# Patient Record
Sex: Female | Born: 1980 | Race: White | Hispanic: No | Marital: Married | Smoking: Never smoker
Health system: Southern US, Community
[De-identification: ages and names within clinical notes are randomized; demographics above are authoritative.]

## PROBLEM LIST (undated history)

## (undated) ENCOUNTER — Inpatient Hospital Stay (HOSPITAL_COMMUNITY): Payer: Self-pay

## (undated) DIAGNOSIS — Q210 Ventricular septal defect: Secondary | ICD-10-CM

## (undated) DIAGNOSIS — R011 Cardiac murmur, unspecified: Secondary | ICD-10-CM

## (undated) DIAGNOSIS — E119 Type 2 diabetes mellitus without complications: Secondary | ICD-10-CM

## (undated) DIAGNOSIS — F32A Depression, unspecified: Secondary | ICD-10-CM

## (undated) DIAGNOSIS — R1310 Dysphagia, unspecified: Secondary | ICD-10-CM

## (undated) DIAGNOSIS — K5900 Constipation, unspecified: Secondary | ICD-10-CM

## (undated) DIAGNOSIS — F329 Major depressive disorder, single episode, unspecified: Secondary | ICD-10-CM

## (undated) HISTORY — DX: Cardiac murmur, unspecified: R01.1

## (undated) HISTORY — PX: TONSILLECTOMY: SUR1361

## (undated) HISTORY — PX: CHOLECYSTECTOMY: SHX55

## (undated) HISTORY — PX: APPENDECTOMY: SHX54

---

## 1999-02-20 HISTORY — PX: VSD REPAIR: SHX276

## 2011-12-23 ENCOUNTER — Inpatient Hospital Stay (HOSPITAL_COMMUNITY): Payer: PRIVATE HEALTH INSURANCE

## 2011-12-23 ENCOUNTER — Encounter (HOSPITAL_COMMUNITY): Payer: Self-pay

## 2011-12-23 ENCOUNTER — Inpatient Hospital Stay (HOSPITAL_COMMUNITY)
Admission: AD | Admit: 2011-12-23 | Discharge: 2011-12-23 | Disposition: A | Discharge status: Home / Self Care | Payer: PRIVATE HEALTH INSURANCE | Source: Ambulatory Visit | Attending: Obstetrics and Gynecology | Admitting: Obstetrics and Gynecology

## 2011-12-23 DIAGNOSIS — O200 Threatened abortion: Secondary | ICD-10-CM | POA: Insufficient documentation

## 2011-12-23 LAB — URINALYSIS, ROUTINE W REFLEX MICROSCOPIC
Bilirubin Urine: NEGATIVE
Glucose, UA: >1000 mg/dL — AB
Specific Gravity, Urine: 1.025 (ref 1.005–1.030)
Urobilinogen, UA: 0.2 mg/dL (ref 0.0–1.0)

## 2011-12-23 LAB — URINE MICROSCOPIC-ADD ON

## 2011-12-23 LAB — CBC
HCT: 33 % — ABNORMAL LOW (ref 36.0–46.0)
Hemoglobin: 11 g/dL — ABNORMAL LOW (ref 12.0–15.0)
MCH: 30.1 pg (ref 26.0–34.0)
MCV: 90.2 fL (ref 78.0–100.0)
Platelets: 246 10*3/uL (ref 150–400)
RBC: 3.66 MIL/uL — ABNORMAL LOW (ref 3.87–5.11)
WBC: 6.6 10*3/uL (ref 4.0–10.5)

## 2011-12-23 LAB — ABO/RH: ABO/RH(D): A POS

## 2011-12-23 NOTE — Progress Notes (Signed)
Patient is here with c/o heavy vaginal bleeding. She states that her lmp was 11/23/2011 and she has been having light bleeding which she gets weekly bhcg at her dr office. She states that she was told that the level was low and her doctor states that it may too early she is miscarring. She states that she is due for a repeat bhcg on Monday.

## 2011-12-23 NOTE — Discharge Instructions (Signed)
Threatened Miscarriage  Bleeding during the first 20 weeks of pregnancy is common. This is sometimes called a threatened miscarriage. This is a pregnancy that is threatening to end before the twentieth week of pregnancy. Often this bleeding stops with bed rest or decreased activities as suggested by your caregiver and the pregnancy continues without any more problems. You may be asked to not have sexual intercourse, have orgasms or use tampons until further notice. Sometimes a threatened miscarriage can progress to a complete or incomplete miscarriage. This may or may not require further treatment. Some miscarriages occur before a woman misses a menstrual period and knows she is pregnant.  Miscarriages occur in 15 to 20% of all pregnancies and usually occur during the first 13 weeks of the pregnancy. The exact cause of a miscarriage is usually never known. A miscarriage is natures way of ending a pregnancy that is abnormal or would not make it to term. There are some things that may put you at risk to have a miscarriage, such as:   Hormone problems.   Infection of the uterus or cervix.   Chronic illness, diabetes for example, especially if it is not controlled.   Abnormal shaped uterus.   Fibroids in the uterus.   Incompetent cervix (the cervix is too weak to hold the baby).   Smoking.   Drinking too much alcohol. It's best not to drink any alcohol when you are pregnant.   Taking illegal drugs.  TREATMENT   When a miscarriage becomes complete and all products of conception (all the tissue in the uterus) have been passed, often no treatment is needed. If you think you passed tissue, save it in a container and take it to your doctor for evaluation. If the miscarriage is incomplete (parts of the fetus or placenta remain in the uterus), further treatment may be needed. The most common reason for further treatment is continued bleeding (hemorrhage) because pregnancy tissue did not pass out of the uterus. This  often occurs if a miscarriage is incomplete. Tissue left behind may also become infected. Treatment usually is dilatation and curettage (the removal of the remaining products of pregnancy. This can be done by a simple sucking procedure (suction curettage) or a simple scraping of the inside of the uterus. This may be done in the hospital or in the caregiver's office. This is only done when your caregiver knows that there is no chance for the pregnancy to proceed to term. This is determined by physical examination, negative pregnancy test, falling pregnancy hormone count and/or, an ultrasound revealing a dead fetus.  Miscarriages are often a very emotional time for prospective mothers and fathers. This is not you or your partners fault. It did not occur because of an inadequacy in you or your partner. Nearly all miscarriages occur because the pregnancy has started off wrongly. At least half of these pregnancies have a chromosomal abnormality. It is almost always not inherited. Others may have developmental problems with the fetus or placenta. This does not always show up even when the products miscarried are studied under the microscope. The miscarriage is nearly always not your fault and it is not likely that you could have prevented it from happening. If you are having emotional and grieving problems, talk to your health care provider and even seek counseling, if necessary, before getting pregnant again. You can begin trying for another pregnancy as soon as your caregiver says it is OK.  HOME CARE INSTRUCTIONS    Your caregiver may order   bed rest depending on how much bleeding and cramping you are having. You may be limited to only getting up to go to the bathroom. You may be allowed to continue light activity. You may need to make arrangements for the care of your other children and for any other responsibilities.   Keep track of the number of pads you use each day, how often you have to change pads and how  saturated (soaked) they are. Record this information.   DO NOT USE TAMPONS. Do not douche, have sexual intercourse or orgasms until approved by your caregiver.   You may receive a follow up appointment for re-evaluation of your pregnancy and a repeat blood test. Re-evaluation often occurs after 2 days and again in 4 to 6 weeks. It is very important that you follow-up in the recommended time period.   If you are Rh negative and the father is Rh positive or you do not know the fathers' blood type, you may receive a shot (Rh immune globulin) to help prevent abnormal antibodies that can develop and affect the baby in any future pregnancies.  SEEK IMMEDIATE MEDICAL CARE IF:   You have severe cramps in your stomach, back, or abdomen.   You have a sudden onset of severe pain in the lower part of your abdomen.   You develop chills.   You run an unexplained temperature of 101 F (38.3 C) or higher.   You pass large clots or tissue. Save any tissue for your caregiver to inspect.   Your bleeding increases or you become light-headed, weak, or have fainting episodes.   You have a gush of fluid from your vagina.   You pass out. This could mean you have a tubal (ectopic) pregnancy.  Document Released: 10/08/2005 Document Revised: 09/27/2011 Document Reviewed: 05/24/2008  [employer] Patient Information 2012 [employer].

## 2011-12-23 NOTE — Progress Notes (Signed)
W. Muhammed CNM in to discuss u/s results and d/c plan. Written and verbal d/c instructions given and understanding voiced 

## 2011-12-23 NOTE — Progress Notes (Signed)
Threasa Heads CNM in to see pt. Spec exam and bimanual done.

## 2011-12-23 NOTE — ED Provider Notes (Signed)
History     Chief Complaint  Patient presents with  . Vaginal Bleeding   HPI  Patient is here with c/o heavy vaginal bleeding that started yesterday around 1pm. She states that her lmp was 11/23/2011 and she has been having light bleeding throughout pregnancy.  Progesterone levels have been low, receiving PO daily.  Ultrasound not completed in office.  Due for a repeat bhcg on Monday.    Past Medical History  Diagnosis Date  . Diabetes mellitus     Past Surgical History  Procedure Date  . 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 Appendectomy   . Tonsillectomy   . Gallbladder surgery     Family History  Problem Relation Age of Onset  . Anesthesia problems Neg Hx   . Hypotension Neg Hx   . Malignant hyperthermia Neg Hx   . Pseudochol deficiency Neg Hx     History  Substance Use Topics  . Smoking status: Never Smoker   . Smokeless tobacco: Not on file  . Alcohol Use: No    Allergies:  Allergies  Allergen Reactions  . Sulfa Antibiotics Hives    Prescriptions prior to admission  Medication Sig Dispense Refill  . insulin glargine (LANTUS) 100 UNIT/ML injection Inject 31 Units into the skin daily.      . insulin lispro (HUMALOG) 100 UNIT/ML injection Inject into the skin 3 (three) times daily before meals.      . progesterone (PROMETRIUM) 200 MG capsule Take 200 mg by mouth daily.        Review of Systems  Gastrointestinal: Positive for abdominal pain (cramping).  Genitourinary:       Vaginal bleeding  All other systems reviewed and are negative.   Physical Exam   Height 5\' 11"  (1.803 m), weight 78.926 kg (174 lb), last menstrual period 11/23/2011.  Physical Exam  Constitutional: She is oriented to person, place, and time. She appears well-developed and well-nourished. No distress.  HENT:  Head: Normocephalic.  Neck: Normal range of motion. Neck supple.  GI: Soft. She exhibits no mass. There is no tenderness.  Genitourinary: There is bleeding (scant, no clots) around the  vagina.       Cervix - closed  Neurological: She is alert and oriented to person, place, and time.  Skin: Skin is warm and dry.    MAU Course  Procedures  Results for orders placed during the hospital encounter of 12/23/11 (from the past 24 hour(s))  CBC     Status: Abnormal   Collection Time   12/23/11  1:28 AM      Component Value Range   WBC 6.6  4.0 - 10.5 (K/uL)   RBC 3.66 (*) 3.87 - 5.11 (MIL/uL)   Hemoglobin 11.0 (*) 12.0 - 15.0 (g/dL)   HCT 16.1 (*) 09.6 - 46.0 (%)   MCV 90.2  78.0 - 100.0 (fL)   MCH 30.1  26.0 - 34.0 (pg)   MCHC 33.3  30.0 - 36.0 (g/dL)   RDW 04.5  40.9 - 81.1 (%)   Platelets 246  150 - 400 (K/uL)  URINALYSIS, ROUTINE W REFLEX MICROSCOPIC     Status: Abnormal   Collection Time   12/23/11  1:30 AM      Component Value Range   Color, Urine YELLOW  YELLOW    APPearance CLEAR  CLEAR    Specific Gravity, Urine 1.025  1.005 - 1.030    pH 6.0  5.0 - 8.0    Glucose, UA >1000 (*) NEGATIVE (mg/dL)  Hgb urine dipstick LARGE (*) NEGATIVE    Bilirubin Urine NEGATIVE  NEGATIVE    Ketones, ur NEGATIVE  NEGATIVE (mg/dL)   Protein, ur NEGATIVE  NEGATIVE (mg/dL)   Urobilinogen, UA 0.2  0.0 - 1.0 (mg/dL)   Nitrite NEGATIVE  NEGATIVE    Leukocytes, UA NEGATIVE  NEGATIVE   URINE MICROSCOPIC-ADD ON     Status: Abnormal   Collection Time   12/23/11  1:30 AM      Component Value Range   Squamous Epithelial / LPF FEW (*) RARE    RBC / HPF TOO NUMEROUS TO COUNT  <3 (RBC/hpf)  POCT PREGNANCY, URINE     Status: Abnormal   Collection Time   12/23/11  1:50 AM      Component Value Range   Preg Test, Ur POSITIVE (*) NEGATIVE   HCG, QUANTITATIVE, PREGNANCY     Status: Abnormal   Collection Time   12/23/11  1:54 AM      Component Value Range   hCG, Beta Chain, Quant, S 692 (*) <5 (mIU/mL)  ABO/RH     Status: Normal   Collection Time   12/23/11  3:14 AM      Component Value Range   ABO/RH(D) A POS     Ultrasound:  Free fluid rt adnexa; peristalsing bowel adjacent to right  ovary; could not identify any adnexal masses. (preliminary report). Consulted with Dr. Henderson Cloud; reviewed labs and preliminary ultrasound report>dc home with follow-up in office on Monday.   Assessment and Plan  Threatened Miscarriage  Plan: DC home F/u in office on Monday Bleeding precautions given   [facility] 12/23/2011, 2:55 AM

## 2012-05-13 ENCOUNTER — Other Ambulatory Visit: Payer: Self-pay | Admitting: Obstetrics and Gynecology

## 2012-05-13 DIAGNOSIS — N6320 Unspecified lump in the left breast, unspecified quadrant: Secondary | ICD-10-CM

## 2012-05-21 ENCOUNTER — Ambulatory Visit
Admission: RE | Admit: 2012-05-21 | Discharge: 2012-05-21 | Disposition: A | Discharge status: Home / Self Care | Payer: PRIVATE HEALTH INSURANCE | Source: Ambulatory Visit | Attending: Obstetrics and Gynecology | Admitting: Obstetrics and Gynecology

## 2012-05-21 DIAGNOSIS — N6320 Unspecified lump in the left breast, unspecified quadrant: Secondary | ICD-10-CM

## 2012-10-22 HISTORY — PX: DILATION AND CURETTAGE OF UTERUS: SHX78

## 2012-10-24 ENCOUNTER — Other Ambulatory Visit: Payer: Self-pay | Admitting: Obstetrics and Gynecology

## 2012-10-24 DIAGNOSIS — N6009 Solitary cyst of unspecified breast: Secondary | ICD-10-CM

## 2012-10-29 ENCOUNTER — Ambulatory Visit
Admission: RE | Admit: 2012-10-29 | Discharge: 2012-10-29 | Disposition: A | Discharge status: Home / Self Care | Payer: PRIVATE HEALTH INSURANCE | Source: Ambulatory Visit | Attending: Obstetrics and Gynecology | Admitting: Obstetrics and Gynecology

## 2012-10-29 DIAGNOSIS — N6009 Solitary cyst of unspecified breast: Secondary | ICD-10-CM

## 2013-01-26 ENCOUNTER — Encounter (HOSPITAL_COMMUNITY): Payer: Self-pay | Admitting: *Deleted

## 2013-01-26 ENCOUNTER — Emergency Department (HOSPITAL_COMMUNITY)
Admission: EM | Admit: 2013-01-26 | Discharge: 2013-01-27 | Disposition: A | Discharge status: Home / Self Care | Payer: Worker's Compensation | Attending: Emergency Medicine | Admitting: Emergency Medicine

## 2013-01-26 DIAGNOSIS — H538 Other visual disturbances: Secondary | ICD-10-CM | POA: Insufficient documentation

## 2013-01-26 DIAGNOSIS — Z794 Long term (current) use of insulin: Secondary | ICD-10-CM | POA: Insufficient documentation

## 2013-01-26 DIAGNOSIS — E119 Type 2 diabetes mellitus without complications: Secondary | ICD-10-CM | POA: Insufficient documentation

## 2013-01-26 DIAGNOSIS — Y9289 Other specified places as the place of occurrence of the external cause: Secondary | ICD-10-CM | POA: Insufficient documentation

## 2013-01-26 DIAGNOSIS — Y939 Activity, unspecified: Secondary | ICD-10-CM | POA: Insufficient documentation

## 2013-01-26 DIAGNOSIS — S060X0A Concussion without loss of consciousness, initial encounter: Secondary | ICD-10-CM | POA: Insufficient documentation

## 2013-01-26 DIAGNOSIS — W208XXA Other cause of strike by thrown, projected or falling object, initial encounter: Secondary | ICD-10-CM | POA: Insufficient documentation

## 2013-01-26 DIAGNOSIS — R110 Nausea: Secondary | ICD-10-CM | POA: Insufficient documentation

## 2013-01-26 DIAGNOSIS — Y990 Civilian activity done for income or pay: Secondary | ICD-10-CM | POA: Insufficient documentation

## 2013-01-26 LAB — GLUCOSE, CAPILLARY: Glucose-Capillary: 294 mg/dL — ABNORMAL HIGH (ref 70–99)

## 2013-01-26 NOTE — ED Notes (Signed)
Pt states she was at work last night, pitcher fell on top of her head and caused her to fall down.  Denies LOC, states her vision was blurred last night but normal now.  Also c/o nausea when it happened, but no nausea at this time.  Still c/o HA.

## 2013-01-27 MED ORDER — METOCLOPRAMIDE HCL 5 MG/ML IJ SOLN
10.0000 mg | Freq: Once | INTRAMUSCULAR | Status: AC
  Administered 2013-01-27: 10 mg via INTRAMUSCULAR
  Filled 2013-01-27: qty 2

## 2013-01-27 MED ORDER — DIPHENHYDRAMINE HCL 50 MG/ML IJ SOLN
25.0000 mg | Freq: Once | INTRAMUSCULAR | Status: AC
  Administered 2013-01-27: 25 mg via INTRAMUSCULAR
  Filled 2013-01-27: qty 1

## 2013-01-27 MED ORDER — DEXAMETHASONE SODIUM PHOSPHATE 10 MG/ML IJ SOLN
10.0000 mg | Freq: Once | INTRAMUSCULAR | Status: AC
  Administered 2013-01-27: 10 mg via INTRAMUSCULAR
  Filled 2013-01-27: qty 1

## 2013-01-27 MED ORDER — KETOROLAC TROMETHAMINE 30 MG/ML IJ SOLN
30.0000 mg | Freq: Once | INTRAMUSCULAR | Status: AC
  Administered 2013-01-27: 30 mg via INTRAMUSCULAR
  Filled 2013-01-27: qty 1

## 2013-01-27 NOTE — ED Provider Notes (Signed)
History     CSN: [id number]  Arrival date & time 01/26/13  2202   First MD Initiated Contact with Patient 01/27/13 0016      Chief Complaint  Patient presents with  . Head Injury   HPI  History provided by the patient. Patient is a 32 year old female with history of diabetes who presents with complaints of continued headache symptoms and occasional nausea following an injury yesterday evening. Patient was at work at [employer] patient in her cupboard per bottle for line she reports a metal picture containing coffee creamer fell off the top shelf landing on her head. The picture fell approximately 3-4 feet before striking her on the head. Patient states this did knock her to her knees on the ground but denies any LOC. She reports having some initial spots in vision with headache and nausea sensation. Nausea and vision symptoms improved she reports a small swelling and tenderness to the scalp. She later returned home and slept and felt better in the morning with symptoms of headache gradually returned later in the afternoon. She has had brief episodes of nausea as well. Denies any vision problems. Denies any significant fatigue. No short-term memory problems. No vertigo or dizziness symptoms. No episodes of vomiting. No weakness or numbness in extremities. No other aggravating or alleviating factors. No past history of significant head injuries.    Past Medical History  Diagnosis Date  . Diabetes mellitus     Past Surgical History  Procedure Laterality Date  . Vsd repair  May 2000  . Appendectomy    . Tonsillectomy    . Gallbladder surgery      Family History  Problem Relation Age of Onset  . Anesthesia problems Neg Hx   . Hypotension Neg Hx   . Malignant hyperthermia Neg Hx   . Pseudochol deficiency Neg Hx     History  Substance Use Topics  . Smoking status: Never Smoker   . Smokeless tobacco: Not on file  . Alcohol Use: No    OB History   Grav Para Term Preterm Abortions  TAB SAB Ect Mult Living   2 0 0 0 1 0 1 0 0 0       Review of Systems  Constitutional: Negative for fever.  HENT: Negative for neck pain.   Gastrointestinal: Positive for nausea.  Neurological: Positive for headaches. Negative for weakness and numbness.  All other systems reviewed and are negative.    Allergies  Sulfa antibiotics  Home Medications   Current Outpatient Rx  Name  Route  Sig  Dispense  Refill  . ibuprofen (ADVIL,MOTRIN) 200 MG tablet   Oral   Take 200-800 mg by mouth every 6 (six) hours as needed for pain.         Marland Kitchen insulin glargine (LANTUS) 100 UNIT/ML injection   Subcutaneous   Inject 22 Units into the skin every morning.          . insulin lispro (HUMALOG) 100 UNIT/ML injection   Subcutaneous   Inject into the skin 3 (three) times daily before meals. Sliding scale-1 unit per 50 over 150 and 1 unit per carb         . naproxen (NAPROSYN) 250 MG tablet   Oral   Take 250 mg by mouth 2 (two) times daily as needed (pain).         . Norethin Ace-Eth Estrad-FE (MINASTRIN 24 FE) 1-20 MG-MCG(24) CHEW   Oral   Chew by mouth.  BP 127/84  Pulse 79  Temp(Src) 97.8 F (36.6 C) (Oral)  Resp 20  SpO2 99%  Physical Exam  Nursing note and vitals reviewed. Constitutional: She is oriented to person, place, and time. She appears well-developed and well-nourished. No distress.  HENT:  Head: Normocephalic and atraumatic.  No significant signs of trauma to the head. Patient does exhibit some scalp tenderness to the right parietal area. There is no erythema of the scalp swelling. No depressed skull. No battle sign or raccoon eyes. No hemotympanum  Eyes: Conjunctivae and EOM are normal. Pupils are equal, round, and reactive to light.  Neck: Normal range of motion. Neck supple.  No meningeal signs  Cardiovascular: Normal rate and regular rhythm.   No murmur heard. Pulmonary/Chest: Effort normal and breath sounds normal. No respiratory distress. She  has no wheezes. She has no rales.  Abdominal: Soft. There is no tenderness. There is no rigidity, no rebound, no guarding, no CVA tenderness and no tenderness at McBurney's point.  Musculoskeletal: Normal range of motion.  Neurological: She is alert and oriented to person, place, and time. She has normal strength. No cranial nerve deficit or sensory deficit. Gait normal.  Skin: Skin is warm and dry. No rash noted.  Psychiatric: She has a normal mood and affect. Her behavior is normal.    ED Course  Procedures       1. Concussion with no loss of consciousness, initial encounter       MDM  12:10AM patient seen and evaluated. Patient well-appearing in no acute distress. No significant signs, to the head. Normal nonfocal neuro exam. No indications for CT imaging.     Angus Seller, PA-C 01/27/13 2154

## 2013-01-27 NOTE — ED Notes (Signed)
Per david PhD benadryl & reglan can be mixed and decadron and toradol can be mixed.

## 2013-01-28 NOTE — ED Provider Notes (Signed)
Medical screening examination/treatment/procedure(s) were performed by non-physician practitioner and as supervising physician I was immediately available for consultation/collaboration.  Altie Savard, MD 01/28/13 0626 

## 2013-12-02 ENCOUNTER — Ambulatory Visit (INDEPENDENT_AMBULATORY_CARE_PROVIDER_SITE_OTHER): Payer: PRIVATE HEALTH INSURANCE | Admitting: Podiatry

## 2013-12-02 ENCOUNTER — Encounter: Payer: Self-pay | Admitting: Podiatry

## 2013-12-02 VITALS — BP 79/46 | HR 76 | Resp 16 | Ht 71.0 in | Wt 173.0 lb

## 2013-12-02 DIAGNOSIS — B353 Tinea pedis: Secondary | ICD-10-CM

## 2013-12-02 MED ORDER — NAFTIFINE HCL 2 % EX CREA: TOPICAL_CREAM | CUTANEOUS | Status: DC

## 2013-12-02 NOTE — Progress Notes (Signed)
   Subjective:    Patient ID: Karen Byrd, female    DOB: 12/12/1980, 33 y.o.   MRN: [id number]  HPI    Review of Systems  Allergic/Immunologic: Negative.   Neurological: Positive for headaches.       Objective:   Physical Exam: Vital signs are stable she is alert and oriented x3. Pulses are strongly palpable bilateral. Her skin looks good I see no lesions. She does have mild interdigital tinea pedis.        Assessment & Plan:  Assessment: Diabetes without complications. Interdigital tinea pedis bilateral.  Plan: Discussed etiology pathology conservative versus surgical therapies at this point I prescribed her Naftin cream 2% to be applied twice daily for a 60 g tube. I will followup with her as needed.

## 2014-01-23 ENCOUNTER — Emergency Department: Payer: Self-pay | Admitting: Emergency Medicine

## 2014-04-29 ENCOUNTER — Emergency Department (HOSPITAL_COMMUNITY): Payer: PRIVATE HEALTH INSURANCE

## 2014-04-29 ENCOUNTER — Emergency Department (HOSPITAL_COMMUNITY)
Admission: EM | Admit: 2014-04-29 | Discharge: 2014-04-29 | Disposition: A | Discharge status: Home / Self Care | Payer: PRIVATE HEALTH INSURANCE | Attending: Emergency Medicine | Admitting: Emergency Medicine

## 2014-04-29 ENCOUNTER — Encounter (HOSPITAL_COMMUNITY): Payer: Self-pay | Admitting: Emergency Medicine

## 2014-04-29 DIAGNOSIS — Z8774 Personal history of (corrected) congenital malformations of heart and circulatory system: Secondary | ICD-10-CM | POA: Insufficient documentation

## 2014-04-29 DIAGNOSIS — E109 Type 1 diabetes mellitus without complications: Secondary | ICD-10-CM | POA: Insufficient documentation

## 2014-04-29 DIAGNOSIS — R079 Chest pain, unspecified: Secondary | ICD-10-CM | POA: Insufficient documentation

## 2014-04-29 DIAGNOSIS — R739 Hyperglycemia, unspecified: Secondary | ICD-10-CM

## 2014-04-29 HISTORY — DX: Ventricular septal defect: Q21.0

## 2014-04-29 HISTORY — DX: Type 2 diabetes mellitus without complications: E11.9

## 2014-04-29 LAB — I-STAT TROPONIN, ED
TROPONIN I, POC: 0 ng/mL (ref 0.00–0.08)
Troponin i, poc: 0 ng/mL (ref 0.00–0.08)

## 2014-04-29 LAB — CBC
HEMATOCRIT: 42.2 % (ref 36.0–46.0)
Hemoglobin: 13.9 g/dL (ref 12.0–15.0)
MCH: 31.2 pg (ref 26.0–34.0)
MCHC: 32.9 g/dL (ref 30.0–36.0)
MCV: 94.6 fL (ref 78.0–100.0)
Platelets: 280 10*3/uL (ref 150–400)
RBC: 4.46 MIL/uL (ref 3.87–5.11)
RDW: 12.3 % (ref 11.5–15.5)
WBC: 4.8 10*3/uL (ref 4.0–10.5)

## 2014-04-29 LAB — BASIC METABOLIC PANEL
Anion gap: 13 (ref 5–15)
BUN: 5 mg/dL — ABNORMAL LOW (ref 6–23)
CALCIUM: 9 mg/dL (ref 8.4–10.5)
CO2: 25 mEq/L (ref 19–32)
CREATININE: 0.62 mg/dL (ref 0.50–1.10)
Chloride: 99 mEq/L (ref 96–112)
GFR calc Af Amer: >90 mL/min (ref >90)
GLUCOSE: 218 mg/dL — AB (ref 70–99)
Potassium: 4.5 mEq/L (ref 3.7–5.3)
Sodium: 137 mEq/L (ref 137–147)

## 2014-04-29 MED ORDER — ASPIRIN 81 MG PO CHEW
324.0000 mg | CHEWABLE_TABLET | Freq: Once | ORAL | Status: AC
  Administered 2014-04-29: 324 mg via ORAL
  Filled 2014-04-29: qty 4

## 2014-04-29 NOTE — ED Provider Notes (Signed)
CSN: [id number]     Arrival date & time 04/29/14  1138 History   First MD Initiated Contact with Patient 04/29/14 1336     Chief Complaint  Patient presents with  . Chest Pain     (Consider location/radiation/quality/duration/timing/severity/associated sxs/prior Treatment) HPI Karen Byrd is a 33 y.o. female with hx of VSD repair as an infant, and type 1 DM, who presents to ED with complaint of chest pain. Pt states she began having left sided dull pressure like chest pain 3 days ago. States last night in the middle of the night she had two episodes of sharp pain lasting few min at a time. She did not take anything for her symptoms. She states pain did not radiate but she does have some tingling sensation in left arm. Denies nausea, vomiting, shortness of breath, diaphoresis.  No worsening pain on exertion. No back pain. No cough. PT is not a smoker. No other cardiac hx. Family hx of MI in her after at age 59.    Past Medical History  Diagnosis Date  . Diabetes mellitus without complication   . VSD (ventricular septal defect)    Past Surgical History  Procedure Laterality Date  . Vsd repair     No family history on file. History  Substance Use Topics  . Smoking status: Never Smoker   . Smokeless tobacco: Not on file  . Alcohol Use: Not on file   OB History   Grav Para Term Preterm Abortions TAB SAB Ect Mult Living                 Review of Systems  Constitutional: Negative for fever and chills.  Respiratory: Positive for chest tightness. Negative for cough and shortness of breath.   Cardiovascular: Positive for chest pain. Negative for palpitations and leg swelling.  Gastrointestinal: Negative for nausea, vomiting, abdominal pain and diarrhea.  Genitourinary: Negative for dysuria, flank pain and pelvic pain.  Musculoskeletal: Negative for arthralgias, myalgias, neck pain and neck stiffness.  Skin: Negative for rash.  Neurological: Negative for dizziness, weakness and  headaches.  All other systems reviewed and are negative.     Allergies  Sulfa antibiotics  Home Medications   Prior to Admission medications   Not on File   BP 105/52  Pulse 80  Temp(Src) 98 F (36.7 C) (Oral)  Resp 18  Wt 170 lb (77.111 kg)  SpO2 100%  LMP 04/21/2014 Physical Exam  Nursing note and vitals reviewed. Constitutional: She appears well-developed and well-nourished. No distress.  HENT:  Head: Normocephalic.  Eyes: Conjunctivae are normal.  Neck: Neck supple.  Cardiovascular: Normal rate, regular rhythm, normal heart sounds and intact distal pulses.   Pulmonary/Chest: Effort normal and breath sounds normal. No respiratory distress. She has no wheezes. She has no rales. She exhibits tenderness.  Tenderness over left pectoralis muscle. Pain reproduced with left arm movement as well  Abdominal: Soft. Bowel sounds are normal. She exhibits no distension. There is no tenderness. There is no rebound.  Musculoskeletal: She exhibits no edema.  Neurological: She is alert.  Skin: Skin is warm and dry.  Psychiatric: She has a normal mood and affect. Her behavior is normal.    ED Course  Procedures (including critical care time) Labs Review Labs Reviewed  BASIC METABOLIC PANEL - Abnormal; Notable for the following:    Glucose, Bld 218 (*)    BUN 5 (*)    All other components within normal limits  CBC  I-STAT TROPOININ, ED  Imaging Review Dg Chest 2 View  04/29/2014   CLINICAL DATA:  One week history of left-sided chest pain ; history of diabetes and VSD  EXAM: CHEST  2 VIEW  COMPARISON:  PA and lateral chest of January 23, 2014  FINDINGS: The lungs are well-expanded and clear. There is stable nodularity projecting over the posterior aspect of the right tenth rib. The heart and pulmonary vascularity are normal. The patient has undergone previous median sternotomy. There is no pleural effusion or pneumothorax.  IMPRESSION: 1. There is no CHF nor pneumonia. 2. Mild  hyperinflation may be voluntary or may reflect underlying reactive airway disease.   Electronically Signed   By: David  Martinique   On: 04/29/2014 12:38     EKG Interpretation   Date/Time:  Thursday April 29 2014 11:41:12 EDT Ventricular Rate:  82 PR Interval:  186 QRS Duration: 80 QT Interval:  394 QTC Calculation: 460 R Axis:   31 Text Interpretation:  Normal sinus rhythm Normal ECG No old tracing to  compare Confirmed by KNAPP  MD-J, JON ([id number]) on 04/29/2014 2:18:57 PM      MDM   Final diagnoses:  Chest pain, unspecified chest pain type  Hyperglycemia    Patient is here with atypical chest pain that has been constant all day today and has been there for 3 days. She is diabetic, history VSD, otherwise healthy. She is TIMI 1 and Heart score 0. Low risk. Will repeat delta trop.   PT's 2nd trop is negative. Home with outpatient follow up . She is comfortable going home. Discussed with Dr. Tomi Bamberger who has seen pt, agrees with the plan.   Filed Vitals:   04/29/14 1518 04/29/14 1530 04/29/14 1617 04/29/14 1625  BP: 109/64 116/63  100/55  Pulse: 78 83 71   Temp:      TempSrc:      Resp: 15 13 14    Weight:      SpO2: 100% 98% 100%        Renold Genta, PA-C 04/29/14 2028

## 2014-04-29 NOTE — Discharge Instructions (Signed)
Please follow up with cardiology either here in [place] or [place]. Return if symptoms worsening.    Chest Pain (Nonspecific) It is often hard to give a specific diagnosis for the cause of chest pain. There is always a chance that your pain could be related to something serious, such as a heart attack or a blood clot in the lungs. You need to follow up with your health care provider for further evaluation. CAUSES   Heartburn.  Pneumonia or bronchitis.  Anxiety or stress.  Inflammation around your heart (pericarditis) or lung (pleuritis or pleurisy).  A blood clot in the lung.  A collapsed lung (pneumothorax). It can develop suddenly on its own (spontaneous pneumothorax) or from trauma to the chest.  Shingles infection (herpes zoster virus). The chest wall is composed of bones, muscles, and cartilage. Any of these can be the source of the pain.  The bones can be bruised by injury.  The muscles or cartilage can be strained by coughing or overwork.  The cartilage can be affected by inflammation and become sore (costochondritis). DIAGNOSIS  Lab tests or other studies may be needed to find the cause of your pain. Your health care provider may have you take a test called an ambulatory electrocardiogram (ECG). An ECG records your heartbeat patterns over a 24-hour period. You may also have other tests, such as:  Transthoracic echocardiogram (TTE). During echocardiography, sound waves are used to evaluate how blood flows through your heart.  Transesophageal echocardiogram (TEE).  Cardiac monitoring. This allows your health care provider to monitor your heart rate and rhythm in real time.  Holter monitor. This is a portable device that records your heartbeat and can help diagnose heart arrhythmias. It allows your health care provider to track your heart activity for several days, if needed.  Stress tests by exercise or by giving medicine that makes the heart beat faster. TREATMENT    Treatment depends on what may be causing your chest pain. Treatment may include:  Acid blockers for heartburn.  Anti-inflammatory medicine.  Pain medicine for inflammatory conditions.  Antibiotics if an infection is present.  You may be advised to change lifestyle habits. This includes stopping smoking and avoiding alcohol, caffeine, and chocolate.  You may be advised to keep your head raised (elevated) when sleeping. This reduces the chance of acid going backward from your stomach into your esophagus. Most of the time, nonspecific chest pain will improve within 2-3 days with rest and mild pain medicine.  HOME CARE INSTRUCTIONS   If antibiotics were prescribed, take them as directed. Finish them even if you start to feel better.  For the next few days, avoid physical activities that bring on chest pain. Continue physical activities as directed.  Do not use any tobacco products, including cigarettes, chewing tobacco, or electronic cigarettes.  Avoid drinking alcohol.  Only take medicine as directed by your health care provider.  Follow your health care provider's suggestions for further testing if your chest pain does not go away.  Keep any follow-up appointments you made. If you do not go to an appointment, you could develop lasting (chronic) problems with pain. If there is any problem keeping an appointment, call to reschedule. SEEK MEDICAL CARE IF:   Your chest pain does not go away, even after treatment.  You have a rash with blisters on your chest.  You have a fever. SEEK IMMEDIATE MEDICAL CARE IF:   You have increased chest pain or pain that spreads to your arm, neck, jaw,  back, or abdomen.  You have shortness of breath.  You have an increasing cough, or you cough up blood.  You have severe back or abdominal pain.  You feel nauseous or vomit.  You have severe weakness.  You faint.  You have chills. This is an emergency. Do not wait to see if the pain will  go away. Get medical help at once. Call your local emergency services (911 in [place] Do not drive yourself to the hospital. MAKE SURE YOU:   Understand these instructions.  Will watch your condition.  Will get help right away if you are not doing well or get worse. Document Released: 07/18/2005 Document Revised: 10/13/2013 Document Reviewed: 05/13/2008 [facility] Patient Information 2015 [place], [place]. This information is not intended to replace advice given to you by your health care provider. Make sure you discuss any questions you have with your health care provider.

## 2014-04-29 NOTE — ED Provider Notes (Signed)
Medical screening examination/treatment/procedure(s) were conducted as a shared visit with non-physician practitioner(s) and myself.  I personally evaluated the patient during the encounter.   EKG Interpretation   Date/Time:  Thursday April 29 2014 11:41:12 EDT Ventricular Rate:  82 PR Interval:  186 QRS Duration: 80 QT Interval:  394 QTC Calculation: 460 R Axis:   31 Text Interpretation:  Normal sinus rhythm Normal ECG No old tracing to  compare Confirmed by Kameryn Tisdel  MD-J, Nicolina Hirt ([id number]) on 04/29/2014 2:18:57 PM      Pt with constance chest pain since yesterday.  TIMI zero although does have diabetes and family history.  Cardiac enzymes and ekg normal.  Doubt acs considering the duration of symptoms and her test results today.  F/u PCP  Dorie Rank, MD 04/29/14 1420

## 2014-04-29 NOTE — ED Notes (Signed)
Pt placed into gown and on monitor upon arrival to room. Pt monitored by 5 lead, blood pressure, and pulse ox.  

## 2014-04-29 NOTE — ED Notes (Signed)
Pt in c/o left sided chest pain with intermittent numbness radiating down her left arm, symptoms have been getting progressively worse, called PMD today and they told her to come in, denies other symptoms with this.

## 2014-04-29 NOTE — ED Notes (Signed)
Placed pt back on monitor upon return to room from restroom

## 2014-06-09 ENCOUNTER — Encounter: Payer: Self-pay | Admitting: Podiatry

## 2014-06-15 ENCOUNTER — Ambulatory Visit: Payer: Self-pay | Admitting: Internal Medicine

## 2014-06-29 ENCOUNTER — Encounter (INDEPENDENT_AMBULATORY_CARE_PROVIDER_SITE_OTHER): Payer: Self-pay

## 2014-06-29 ENCOUNTER — Ambulatory Visit (INDEPENDENT_AMBULATORY_CARE_PROVIDER_SITE_OTHER): Payer: 59 | Admitting: Internal Medicine

## 2014-06-29 ENCOUNTER — Encounter: Payer: Self-pay | Admitting: Internal Medicine

## 2014-06-29 VITALS — BP 96/60 | HR 77 | Temp 98.0°F | Ht 70.5 in | Wt 170.0 lb

## 2014-06-29 DIAGNOSIS — N6019 Diffuse cystic mastopathy of unspecified breast: Secondary | ICD-10-CM

## 2014-06-29 DIAGNOSIS — L709 Acne, unspecified: Secondary | ICD-10-CM | POA: Insufficient documentation

## 2014-06-29 DIAGNOSIS — N6012 Diffuse cystic mastopathy of left breast: Secondary | ICD-10-CM

## 2014-06-29 DIAGNOSIS — L708 Other acne: Secondary | ICD-10-CM

## 2014-06-29 DIAGNOSIS — E109 Type 1 diabetes mellitus without complications: Secondary | ICD-10-CM

## 2014-06-29 NOTE — Assessment & Plan Note (Signed)
Continue to follow with endocrinology

## 2014-06-29 NOTE — Assessment & Plan Note (Signed)
Next mammogram at age 33

## 2014-06-29 NOTE — Progress Notes (Signed)
Pre visit review using our clinic review tool, if applicable. No additional management support is needed unless otherwise documented below in the visit note. 

## 2014-06-29 NOTE — Patient Instructions (Addendum)
Type 1 Diabetes Mellitus Type 1 diabetes mellitus, often simply referred to as diabetes, is a long-term (chronic) disease. It occurs when the islet cells in the pancreas that make insulin (a hormone) are destroyed and can no longer make insulin. Insulin is needed to move sugars from food into the tissue cells. The tissue cells use the sugars for energy. In people with type 1 diabetes, the sugars build up in the blood instead of going into the tissue cells. As a result, high blood sugar (hyperglycemia) develops. Without insulin, the body breaks down fat cells for the needed energy. This breakdown of fat cells produces acid chemicals (ketones), which increases the acid levels in the body. The effect of either high ketone or high sugar (glucose) levels can be life-threatening.  Type 1 diabetes was also previously called juvenile diabetes. It most often occurs before the age of 30, but it can occur at any age. RISK FACTORS A person is predisposed to developing type 1 diabetes if someone in his or her family has the disease and is exposed to certain additional environmental triggers.  SYMPTOMS  Symptoms of type 1 diabetes may develop gradually over days to weeks or suddenly. The symptoms occur due to hyperglycemia. The symptoms can include:   Increased thirst (polydipsia).  Increased urination (polyuria).  Increased urination during the night (nocturia).  Weight loss. This weight loss may be rapid.  Frequent, recurring infections.  Tiredness (fatigue).  Weakness.  Vision changes, such as blurred vision.  Fruity smell to your breath.  Abdominal pain.  Nausea or vomiting. DIAGNOSIS  Type 1 diabetes is diagnosed when symptoms of diabetes are present and when blood glucose levels are increased. Your blood glucose level may be checked by one or more of the following blood tests:  A fasting blood glucose test. You will not be allowed to eat for at least 8 hours before a blood sample is  taken.  A random blood glucose test. Your blood glucose is checked at any time of the day regardless of when you ate.  A hemoglobin A1c blood glucose test. A hemoglobin A1c test provides information about blood glucose control over the previous 3 months. TREATMENT  Although type 1 diabetes cannot be prevented, it can be managed with insulin, diet, and exercise.  You will need to take insulin daily to keep blood glucose in the desired range.  You will need to match insulin dosing with exercise and healthy food choices. The treatment goal is to maintain the before-meal blood sugar (preprandial glucose) level at 70-130 mg/dL.  HOME CARE INSTRUCTIONS   Have your hemoglobin A1c level checked twice a year.  Perform daily blood glucose monitoring as directed by your health care provider.  Monitor urine ketones when you are ill and as directed by your health care provider.  Take your insulin as directed by your health care provider to maintain your blood glucose level in the desired range.  Never run out of insulin. It is needed every day.  Adjust insulin based on your intake of carbohydrates. Carbohydrates can raise blood glucose levels but need to be included in your diet. Carbohydrates provide vitamins, minerals, and fiber, which are an essential part of a healthy diet. Carbohydrates are found in fruits, vegetables, whole grains, dairy products, legumes, and foods containing added sugars.  Eat healthy foods. Alternate 3 meals with 3 snacks.  Maintain a healthy weight.  Carry a medical alert card or wear your medical alert jewelry.  Carry a 15-gram carbohydrate snack   with you at all times to treat low blood glucose (hypoglycemia). Some examples of 15-gram carbohydrate snacks include:  Glucose tablets, 3 or 4.  Glucose gel, 15-gram tube.  Raisins, 2 tablespoons (24 grams).  Jelly beans, 6.  Animal crackers, 8.  Fruit juice, regular soda, or low-fat milk, 4 ounces (120  mL).  Gummy treats, 9.  Recognize hypoglycemia. Hypoglycemia occurs with blood glucose levels of 70 mg/dL and below. The risk for hypoglycemia increases when fasting or skipping meals, during or after intense exercise, and during sleep. Hypoglycemia symptoms can include:  Tremors or shakes.  Decreased ability to concentrate.  Sweating.  Increased heart rate.  Headache.  Dry mouth.  Hunger.  Irritability.  Anxiety.  Restless sleep.  Altered speech or coordination.  Confusion.  Treat hypoglycemia promptly. If you are alert and able to safely swallow, follow the 15:15 rule:  Take 15-20 grams of rapid-acting glucose or carbohydrate. Rapid-acting options include glucose gel, glucose tablets, or 4 ounces (120 mL) of fruit juice, regular soda, or low-fat milk.  Check your blood glucose level 15 minutes after taking the glucose.  Take 15-20 grams more of glucose if the repeat blood glucose level is still 70 mg/dL or below.  Eat a meal or snack within 1 hour once blood glucose levels return to normal.  Be alert to polyuria and polydipsia, which are early signs of hyperglycemia. An early awareness of hyperglycemia allows for prompt treatment. Treat hyperglycemia as directed by your health care provider.  Exercise regularly as directed by your health care provider. This includes:  Performing resistance training twice a week such as push-ups, sit-ups, lifting weights, or using resistance bands.  Performing 150 minutes of cardio exercises each week such as walking, running, or playing sports.  Staying active and spending no more than 90 minutes at one time being inactive.  Adjust your insulin dosing and food intake as needed if you start a new exercise or sport.  Follow your sick-day plan at any time you are unable to eat or drink as usual.   Do not use any tobacco products including cigarettes, chewing tobacco, or electronic cigarettes. If you need help quitting, ask your  health care provider.  Limit alcohol intake to no more than 1 drink per day for nonpregnant women and 2 drinks per day for men. You should drink alcohol only when you are also eating food. Talk with your health care provider about whether alcohol is safe for you. Tell your health care provider if you drink alcohol several times a week.  Keep all follow-up visits as directed by your health care provider.  Schedule an eye exam within 5 years of diagnosis and then annually.  Perform daily skin and foot care. Examine your skin and feet daily for cuts, bruises, redness, nail problems, bleeding, blisters, or sores. A foot exam by a health care provider should be done annually.  Brush your teeth and gums at least twice a day and floss at least once a day. Follow up with your dentist regularly.  Share your diabetes management plan with your workplace or school.  Stay up-to-date with immunizations. It is recommended that people with diabetes who are over 42 years old get the pneumonia vaccine. In some cases, two separate shots may be given. Ask your health care provider if your pneumonia vaccination is up-to-date.  Learn to manage stress.  Obtain ongoing diabetes education and support as needed.  Participate in or seek rehabilitation as needed to maintain or improve independence  and quality of life. Request a physical or occupational therapy referral if you are having foot or hand numbness, or difficulties with grooming, dressing, eating, or physical activity. SEEK MEDICAL CARE IF:   You are unable to eat food or drink fluids for more than 6 hours.  You have nausea and vomiting for more than 6 hours.  Your blood glucose level is over 240 mg/dL.  There is a change in mental status.  You develop an additional serious illness.  You have diarrhea for more than 6 hours.  You have been sick or have had a fever for a couple of days and are not getting better.  You have pain during any physical  activity. SEEK IMMEDIATE MEDICAL CARE IF:  You have difficulty breathing.  You have moderate to large ketone levels. MAKE SURE YOU:  Understand these instructions.  Will watch your condition.  Will get help right away if you are not doing well or get worse. Document Released: 10/05/2000 Document Revised: 02/22/2014 Document Reviewed: 05/06/2012 [employer] Patient Information 2015 [employer]. This information is not intended to replace advice given to you by your health care provider. Make sure you discuss any questions you have with your health care provider.  

## 2014-06-29 NOTE — Assessment & Plan Note (Signed)
On doxycycline and Dapsone daily

## 2014-06-29 NOTE — Progress Notes (Signed)
HPI  Pt presents to the clinic today to establish care. She has not had a PCP in many years. She is a type 1 DM. She does follow with endocrinology. She is working towards getting an insulin pump. She has no concerns today.  Flu: yearly Tetanus: unsure LMP: IUD Pap Smear: 2014- normal Mammogram: 2015- fibrocystic breast disease Eye exam: yearly Dentist: biannually  Past Medical History  Diagnosis Date  . Diabetes mellitus   . Diabetes mellitus without complication   . VSD (ventricular septal defect)     Current Outpatient Prescriptions  Medication Sig Dispense Refill  . Dapsone 5 % topical gel Apply 1 application topically 2 (two) times daily.      Marland Kitchen DOXYCYCLINE HYCLATE PO Take 1 capsule by mouth daily.      . Insulin Glargine (LANTUS SOLOSTAR) 100 UNIT/ML Solostar Pen Inject 28 Units into the skin every morning.      . insulin lispro (HUMALOG KWIKPEN) 100 UNIT/ML KiwkPen Inject 1-10 Units into the skin 3 (three) times daily before meals. According to sliding scale.      . Naftifine HCl 2 % CREA Apply a small amount of cream to foot once daily for two weeks.  60 g  5  . tretinoin (RETIN-A) 0.1 % cream Apply 1 application topically at bedtime.       No current facility-administered medications for this visit.    Allergies  Allergen Reactions  . Sulfa Antibiotics Hives  . Sulfa Antibiotics Hives    "massive hives"    Family History  Problem Relation Age of Onset  . Anesthesia problems Neg Hx   . Hypotension Neg Hx   . Malignant hyperthermia Neg Hx   . Pseudochol deficiency Neg Hx   . Hypertension Mother   . Diabetes Mother   . Hyperlipidemia Father   . Hypertension Father   . Heart disease Maternal Grandfather   . Diabetes Maternal Grandfather   . Stroke Maternal Grandfather   . Hyperlipidemia Paternal Grandmother   . Heart disease Paternal Grandmother   . Hypertension Paternal Grandmother   . Diabetes Paternal Grandmother   . Stroke Paternal Grandmother   .  Hyperlipidemia Paternal Grandfather   . Hypertension Paternal Grandfather   . Stroke Paternal Grandfather   . Cancer Maternal Grandmother     leukemia    History   Social History  . Marital Status: Married    Spouse Name: N/A    Number of Children: N/A  . Years of Education: N/A   Occupational History  . Not on file.   Social History Main Topics  . Smoking status: Never Smoker   . Smokeless tobacco: Never Used  . Alcohol Use: Yes     Comment: occasional  . Drug Use: No  . Sexual Activity: Yes    Birth Control/ Protection: IUD   Other Topics Concern  . Not on file   Social History Narrative   ** Merged History Encounter **        ROS:  Constitutional: Denies fever, malaise, fatigue, headache or abrupt weight changes.  Respiratory: Denies difficulty breathing, shortness of breath, cough or sputum production.   Cardiovascular: Denies chest pain, chest tightness, palpitations or swelling in the hands or feet.  Skin: Denies redness, rashes, lesions or ulcercations.  Neurological: Denies dizziness, difficulty with memory, difficulty with speech or problems with balance and coordination.   No other specific complaints in a complete review of systems (except as listed in HPI above).  PE:  BP  96/60  Pulse 77  Temp(Src) 98 F (36.7 C) (Oral)  Ht 5' 10.5" (1.791 m)  Wt 170 lb (77.111 kg)  BMI 24.04 kg/m2  SpO2 98%  LMP 06/16/2014 Wt Readings from Last 3 Encounters:  06/29/14 170 lb (77.111 kg)  04/29/14 170 lb (77.111 kg)  12/02/13 173 lb (78.472 kg)    General: Appearsher stated age, well developed, well nourished in NAD. Cardiovascular: Normal rate and rhythm. S1,S2 noted. Murmur noted.  No rubs or gallops noted.  Pulmonary/Chest: Normal effort and positive vesicular breath sounds. No respiratory distress. No wheezes, rales or ronchi noted.   BMET    Component Value Date/Time   NA 137 04/29/2014 1156   K 4.5 04/29/2014 1156   CL 99 04/29/2014 1156   CO2 25  04/29/2014 1156   GLUCOSE 218* 04/29/2014 1156   BUN 5* 04/29/2014 1156   CREATININE 0.62 04/29/2014 1156   CALCIUM 9.0 04/29/2014 1156   GFRNONAA >90 04/29/2014 1156   GFRAA >90 04/29/2014 1156    Lipid Panel  No results found for this basename: chol, trig, hdl, cholhdl, vldl, ldlcalc    CBC    Component Value Date/Time   WBC 4.8 04/29/2014 1156   RBC 4.46 04/29/2014 1156   HGB 13.9 04/29/2014 1156   HCT 42.2 04/29/2014 1156   PLT 280 04/29/2014 1156   MCV 94.6 04/29/2014 1156   MCH 31.2 04/29/2014 1156   MCHC 32.9 04/29/2014 1156   RDW 12.3 04/29/2014 1156    Hgb A1C No results found for this basename: HGBA1C     Assessment and Plan:  Make an appt for your physical

## 2014-07-12 ENCOUNTER — Encounter: Payer: 59 | Admitting: Internal Medicine

## 2014-07-12 ENCOUNTER — Ambulatory Visit (INDEPENDENT_AMBULATORY_CARE_PROVIDER_SITE_OTHER): Payer: 59

## 2014-07-12 ENCOUNTER — Ambulatory Visit (INDEPENDENT_AMBULATORY_CARE_PROVIDER_SITE_OTHER): Payer: 59 | Admitting: Podiatry

## 2014-07-12 VITALS — BP 106/71 | HR 87 | Resp 16

## 2014-07-12 DIAGNOSIS — M779 Enthesopathy, unspecified: Secondary | ICD-10-CM

## 2014-07-12 DIAGNOSIS — M2040 Other hammer toe(s) (acquired), unspecified foot: Secondary | ICD-10-CM

## 2014-07-12 DIAGNOSIS — Z0289 Encounter for other administrative examinations: Secondary | ICD-10-CM

## 2014-07-12 NOTE — Progress Notes (Signed)
She presents today chief complaint of pain to the second digit of her left foot. She states that her tinea pedis seems to be getting better.  Objective: Vital signs are stable she is alert and oriented x3. She has a hammertoe deformity second digit of the left foot with mild erythema at the PIPJ. This is painful on palpation and painful on full extension. The PIPJ will not extend 100%. Radiographic evaluation does not demonstrate any type of osseous abnormalities other than flexible hammertoe remedy a lateral view.  Assessment hammertoe deformity capsulitis PIPJ second left.  Plan: Discussed etiology pathology conservative versus surgical therapies. Offered her a dexamethasone subjection she declined. I did give her a prescription car for Naftin cream. I will followup with her in the near future.

## 2014-07-13 ENCOUNTER — Telehealth: Payer: Self-pay | Admitting: Internal Medicine

## 2014-07-13 NOTE — Telephone Encounter (Signed)
I would call her to see if she wants to reschedule

## 2014-07-13 NOTE — Telephone Encounter (Signed)
Patient did not come for their scheduled appointment 07/12/14 for cpx.  Please let me know if the patient needs to be contacted immediately for follow up or if no follow up is necessary.

## 2014-07-14 NOTE — Telephone Encounter (Signed)
Left message asking pt to call office.  See if pt would like to r/s her cpx that she missed

## 2014-07-20 NOTE — Telephone Encounter (Signed)
Spoke to pt  Pt will call back to r/s

## 2014-07-21 ENCOUNTER — Ambulatory Visit (INDEPENDENT_AMBULATORY_CARE_PROVIDER_SITE_OTHER): Payer: 59 | Admitting: Psychology

## 2014-07-21 DIAGNOSIS — F4323 Adjustment disorder with mixed anxiety and depressed mood: Secondary | ICD-10-CM

## 2014-08-19 ENCOUNTER — Ambulatory Visit (INDEPENDENT_AMBULATORY_CARE_PROVIDER_SITE_OTHER): Payer: 59 | Admitting: Psychology

## 2014-08-19 DIAGNOSIS — F4323 Adjustment disorder with mixed anxiety and depressed mood: Secondary | ICD-10-CM

## 2014-08-23 ENCOUNTER — Encounter: Payer: Self-pay | Admitting: Internal Medicine

## 2014-08-24 ENCOUNTER — Ambulatory Visit (INDEPENDENT_AMBULATORY_CARE_PROVIDER_SITE_OTHER): Payer: 59 | Admitting: Internal Medicine

## 2014-08-24 ENCOUNTER — Encounter: Payer: Self-pay | Admitting: Internal Medicine

## 2014-08-24 VITALS — BP 104/58 | HR 81 | Temp 97.9°F | Wt 181.0 lb

## 2014-08-24 DIAGNOSIS — G43001 Migraine without aura, not intractable, with status migrainosus: Secondary | ICD-10-CM

## 2014-08-24 DIAGNOSIS — R110 Nausea: Secondary | ICD-10-CM

## 2014-08-24 DIAGNOSIS — R232 Flushing: Secondary | ICD-10-CM

## 2014-08-24 DIAGNOSIS — N951 Menopausal and female climacteric states: Secondary | ICD-10-CM

## 2014-08-24 DIAGNOSIS — R739 Hyperglycemia, unspecified: Secondary | ICD-10-CM

## 2014-08-24 MED ORDER — KETOROLAC TROMETHAMINE 30 MG/ML IJ SOLN
30.0000 mg | Freq: Once | INTRAMUSCULAR | Status: AC
  Administered 2014-08-24: 30 mg via INTRAMUSCULAR

## 2014-08-24 MED ORDER — KETOROLAC TROMETHAMINE 30 MG/ML IM SOLN: 30.0000 mg | Freq: Once | INTRAMUSCULAR | Status: DC

## 2014-08-24 MED ORDER — BUTALBITAL-APAP-CAFFEINE 50-325-40 MG PO TABS: 1.0000 | ORAL_TABLET | Freq: Two times a day (BID) | ORAL | Status: DC | PRN

## 2014-08-24 NOTE — Patient Instructions (Signed)

## 2014-08-24 NOTE — Progress Notes (Signed)
Pre visit review using our clinic review tool, if applicable. No additional management support is needed unless otherwise documented below in the visit note. 

## 2014-08-24 NOTE — Progress Notes (Addendum)
Subjective:    Patient ID: Karen Byrd, female    DOB: Dec 16, 1980, 33 y.o.   MRN: [id number]  HPI  Pt presents to the clinic today with c/o headaches. She reports this started 3-4 weeks ago. She wakes up with it every morning. She reports that it feels like "someone is grabbing me" a the base of her skull. It can radiate at times up to the top of her head. It is usually only on the right side. She has had some associated ear pressure, blurred vision and nausea but denies vomiting. She has tried ibuprofen with some relief. She has no history of migraines. She is under a lot of stress. She denies head trauma. She also reports hot flashes but not sure if they are associated. She is having regular periods. She came off birth control 09/2013 and had a paragaurd inserted. She reports that her sugars have also been very elevated the last few weeks. She has not had a change in her diet or exercise.  Review of Systems      Past Medical History  Diagnosis Date  . Diabetes mellitus   . Diabetes mellitus without complication   . VSD (ventricular septal defect)     Current Outpatient Prescriptions  Medication Sig Dispense Refill  . Dapsone 5 % topical gel Apply 1 application topically 2 (two) times daily.    Marland Kitchen DOXYCYCLINE HYCLATE PO Take 1 capsule by mouth daily.    . Insulin Glargine (LANTUS SOLOSTAR) 100 UNIT/ML Solostar Pen Inject 28 Units into the skin every morning.    . insulin lispro (HUMALOG KWIKPEN) 100 UNIT/ML KiwkPen Inject 1-10 Units into the skin 3 (three) times daily before meals. According to sliding scale.    . Naftifine HCl 2 % CREA Apply a small amount of cream to foot once daily for two weeks. 60 g 5  . tretinoin (RETIN-A) 0.1 % cream Apply 1 application topically at bedtime.     No current facility-administered medications for this visit.    Allergies  Allergen Reactions  . Sulfa Antibiotics Hives  . Sulfa Antibiotics Hives    "massive hives"    Family History    Problem Relation Age of Onset  . Anesthesia problems Neg Hx   . Hypotension Neg Hx   . Malignant hyperthermia Neg Hx   . Pseudochol deficiency Neg Hx   . Hypertension Mother   . Diabetes Mother   . Hyperlipidemia Father   . Hypertension Father   . Heart disease Maternal Grandfather   . Diabetes Maternal Grandfather   . Stroke Maternal Grandfather   . Hyperlipidemia Paternal Grandmother   . Heart disease Paternal Grandmother   . Hypertension Paternal Grandmother   . Diabetes Paternal Grandmother   . Stroke Paternal Grandmother   . Hyperlipidemia Paternal Grandfather   . Hypertension Paternal Grandfather   . Stroke Paternal Grandfather   . Cancer Maternal Grandmother     leukemia    History   Social History  . Marital Status: Married    Spouse Name: N/A    Number of Children: N/A  . Years of Education: N/A   Occupational History  . Not on file.   Social History Main Topics  . Smoking status: Never Smoker   . Smokeless tobacco: Never Used  . Alcohol Use: Yes     Comment: occasional  . Drug Use: No  . Sexual Activity: Yes    Birth Control/ Protection: IUD   Other Topics Concern  . Not  on file   Social History Narrative   ** Merged History Encounter **         Constitutional: Pt reports headaches and hot flashes. Denies fever, malaise, fatigue, or abrupt weight changes.  HEENT: Pt reports ear pain, blurred vision. Denies eye pain, eye redness, ringing in the ears, wax buildup, runny nose, nasal congestion, bloody nose, or sore throat. Gastrointestinal: Pt reports nausea. Denies abdominal pain, bloating, constipation, diarrhea or blood in the stool.  Neurological: Denies dizziness, difficulty with memory, difficulty with speech or problems with balance and coordination.   No other specific complaints in a complete review of systems (except as listed in HPI above).  Objective:   Physical Exam  BP 104/58 mmHg  Pulse 81  Temp(Src) 97.9 F (36.6 C) (Oral)   Wt 181 lb (82.101 kg)  SpO2 98% Wt Readings from Last 3 Encounters:  08/24/14 181 lb (82.101 kg)  06/29/14 170 lb (77.111 kg)  04/29/14 170 lb (77.111 kg)    General: Appears her stated age, clammy and ill appearing in NAD. HEENT: Head: normal shape and size; Eyes: sclera white, no icterus, conjunctiva pink, PERRLA and EOMs intact; Ears: Tm's gray and intact, normal light reflex; Nose: mucosa pink and moist, septum midline; Throat/Mouth: Teeth present, mucosa pink and moist, no exudate, lesions or ulcerations noted.  Cardiovascular: Normal rate and rhythm. S1,S2 noted.  No murmur, rubs or gallops noted.  Pulmonary/Chest: Normal effort and positive vesicular breath sounds. No respiratory distress. No wheezes, rales or ronchi noted.  Neurological: Alert and oriented.    BMET    Component Value Date/Time   NA 137 04/29/2014 1156   K 4.5 04/29/2014 1156   CL 99 04/29/2014 1156   CO2 25 04/29/2014 1156   GLUCOSE 218* 04/29/2014 1156   BUN 5* 04/29/2014 1156   CREATININE 0.62 04/29/2014 1156   CALCIUM 9.0 04/29/2014 1156   GFRNONAA >90 04/29/2014 1156   GFRAA >90 04/29/2014 1156    Lipid Panel  No results found for: CHOL, TRIG, HDL, CHOLHDL, VLDL, LDLCALC  CBC    Component Value Date/Time   WBC 4.8 04/29/2014 1156   RBC 4.46 04/29/2014 1156   HGB 13.9 04/29/2014 1156   HCT 42.2 04/29/2014 1156   PLT 280 04/29/2014 1156   MCV 94.6 04/29/2014 1156   MCH 31.2 04/29/2014 1156   MCHC 32.9 04/29/2014 1156   RDW 12.3 04/29/2014 1156    Hgb A1C No results found for: HGBA1C       Assessment & Plan:   Headaches, consistent with migraine:  Will try to break the headache cycle with 30 mg Toradol RX given for fioricet if toradol not effect Will check CBC, CMET and TSH If CBC elevated, consider infectious etiology ? Meningitis (would also explain hyperglycemia)  If fioricet not effective, consider CT head  Hot flashes:  Will check FSH/LH  Will call you after labs are  back   RTC as needed or if symptoms persist or worsen

## 2014-08-25 ENCOUNTER — Telehealth: Payer: Self-pay | Admitting: Internal Medicine

## 2014-08-25 ENCOUNTER — Other Ambulatory Visit (INDEPENDENT_AMBULATORY_CARE_PROVIDER_SITE_OTHER): Payer: 59

## 2014-08-25 LAB — FOLLICLE STIMULATING HORMONE: FSH: 4.1 m[IU]/mL

## 2014-08-25 LAB — CBC
HCT: 41.3 % (ref 36.0–46.0)
Hemoglobin: 13.9 g/dL (ref 12.0–15.0)
MCHC: 33.5 g/dL (ref 30.0–36.0)
MCV: 89.7 fl (ref 78.0–100.0)
PLATELETS: 175 10*3/uL (ref 150.0–400.0)
RBC: 4.61 Mil/uL (ref 3.87–5.11)
RDW: 12.9 % (ref 11.5–15.5)
WBC: 6.3 10*3/uL (ref 4.0–10.5)

## 2014-08-25 LAB — COMPREHENSIVE METABOLIC PANEL
ALBUMIN: 2.9 g/dL — AB (ref 3.5–5.2)
ALK PHOS: 61 U/L (ref 39–117)
ALT: 61 U/L — ABNORMAL HIGH (ref 0–35)
AST: 70 U/L — ABNORMAL HIGH (ref 0–37)
BUN: 9 mg/dL (ref 6–23)
CALCIUM: 8.5 mg/dL (ref 8.4–10.5)
CO2: 26 mEq/L (ref 19–32)
Chloride: 99 mEq/L (ref 96–112)
Creatinine, Ser: 0.7 mg/dL (ref 0.4–1.2)
GFR: 106.61 mL/min (ref >60.00)
Glucose, Bld: 303 mg/dL — ABNORMAL HIGH (ref 70–99)
POTASSIUM: 4.5 meq/L (ref 3.5–5.1)
SODIUM: 131 meq/L — AB (ref 135–145)
TOTAL PROTEIN: 6.3 g/dL (ref 6.0–8.3)
Total Bilirubin: 0.9 mg/dL (ref 0.2–1.2)

## 2014-08-25 LAB — TSH: TSH: 0.82 u[IU]/mL (ref 0.35–4.50)

## 2014-08-25 NOTE — Telephone Encounter (Signed)
Ok, is she okay with proceeding with head CT? If so, I will order

## 2014-08-25 NOTE — Telephone Encounter (Signed)
Patient left voicemail stating that Karen Byrd advised patient to call the office today if she was still having a headache. Patient stated that she is still having a headache and it is not any better since office visit yesterday.

## 2014-08-25 NOTE — Addendum Note (Signed)
Addended by: Daralene Milch C on: 08/25/2014 11:59 AM   Modules accepted: Orders

## 2014-08-25 NOTE — Telephone Encounter (Signed)
Please refer to msg below and advise.

## 2014-08-26 ENCOUNTER — Telehealth: Payer: Self-pay | Admitting: Internal Medicine

## 2014-08-26 NOTE — Telephone Encounter (Signed)
Pt left v/m requesting cb at [phone] when 08/25/14 lab results available.

## 2014-08-26 NOTE — Telephone Encounter (Signed)
Labs show low sodium and elevated liver enzymes. Not sure what is causing her lab abnormalities. I want to see if she is ok with me ordering a CT of her head

## 2014-08-27 ENCOUNTER — Telehealth: Payer: Self-pay | Admitting: Radiology

## 2014-08-27 NOTE — Telephone Encounter (Signed)
I think I sent you a result note about this- let me know if I didn't

## 2014-08-27 NOTE — Telephone Encounter (Signed)
Left message on voicemail.

## 2014-08-27 NOTE — Telephone Encounter (Signed)
The [facility] lab called, they didn't have enough blood to finish running the [facility]. I contacted the pt and she will come in on Monday. She did want to know her other tests results. I told her that I would ask you to review and call her today. It is ok to leave a voice mail. Thanks, terri

## 2014-10-12 ENCOUNTER — Ambulatory Visit: Payer: 59 | Admitting: Psychology

## 2014-11-29 ENCOUNTER — Ambulatory Visit (INDEPENDENT_AMBULATORY_CARE_PROVIDER_SITE_OTHER): Payer: Self-pay | Admitting: Internal Medicine

## 2014-11-29 ENCOUNTER — Encounter: Payer: Self-pay | Admitting: Internal Medicine

## 2014-11-29 VITALS — BP 106/62 | HR 77 | Temp 98.2°F | Wt 181.0 lb

## 2014-11-29 DIAGNOSIS — R1114 Bilious vomiting: Secondary | ICD-10-CM

## 2014-11-29 LAB — COMPREHENSIVE METABOLIC PANEL
ALK PHOS: 47 U/L (ref 39–117)
ALT: 11 U/L (ref 0–35)
AST: 17 U/L (ref 0–37)
Albumin: 3.7 g/dL (ref 3.5–5.2)
BUN: 7 mg/dL (ref 6–23)
CALCIUM: 8.3 mg/dL — AB (ref 8.4–10.5)
CO2: 27 mEq/L (ref 19–32)
Chloride: 102 mEq/L (ref 96–112)
Creatinine, Ser: 0.58 mg/dL (ref 0.40–1.20)
GFR: 125.72 mL/min (ref >60.00)
Glucose, Bld: 264 mg/dL — ABNORMAL HIGH (ref 70–99)
POTASSIUM: 3.7 meq/L (ref 3.5–5.1)
Sodium: 134 mEq/L — ABNORMAL LOW (ref 135–145)
Total Bilirubin: 1 mg/dL (ref 0.2–1.2)
Total Protein: 6.3 g/dL (ref 6.0–8.3)

## 2014-11-29 LAB — CBC
HEMATOCRIT: 40.3 % (ref 36.0–46.0)
Hemoglobin: 13.8 g/dL (ref 12.0–15.0)
MCHC: 34.3 g/dL (ref 30.0–36.0)
MCV: 89.2 fl (ref 78.0–100.0)
Platelets: 260 10*3/uL (ref 150.0–400.0)
RBC: 4.51 Mil/uL (ref 3.87–5.11)
RDW: 13.6 % (ref 11.5–15.5)
WBC: 7.8 10*3/uL (ref 4.0–10.5)

## 2014-11-29 LAB — H. PYLORI ANTIBODY, IGG: H Pylori IgG: NEGATIVE

## 2014-11-29 LAB — AMYLASE: Amylase: 26 U/L — ABNORMAL LOW (ref 27–131)

## 2014-11-29 LAB — LIPASE: LIPASE: 4 U/L — AB (ref 11.0–59.0)

## 2014-11-29 MED ORDER — PROMETHAZINE HCL 12.5 MG PO TABS: 12.5000 mg | ORAL_TABLET | Freq: Four times a day (QID) | ORAL | Status: DC | PRN

## 2014-11-29 NOTE — Progress Notes (Signed)
HPI  Karen Byrd is a 34 y.o. female presenting with c/o nausea, vomiting and abdominal pain x 3 days. Nausea has improved some today, vomiting is intermittent and abdominal pain is constant. She hasn't been able to eat much; had 1/2 of a plain biscuit and 1 saltine cracker yesterday and threw up shortly after eating the cracker. Vomit is yellow and foamy. She reports fever of 101. Today temp is 98.2. Stomach pain is in the upper abdominal area. Pain has been 8 or 9/10; but currently a 5/10. No sick contacts. Type 1 diabetic, sugars have been running in the 180's.  She does not drink alcohol and no history of pancreatitis.   Gallbladder and appendix removed - both removed at same time, more than 10 years ago. No hx of GERD. Started out like a typical headache; Headaches have subsided; nausea stayed  Started minocycline and clindamycin a month ago.  No urinary problems. Little SOB when pain comes on. Stabbing and twisting pain. Pain in back but not sure if it's from vomiting violently    No injuries. May have pulled muscles when vomiting - crackers and yellow foam.  Past Medical History  Diagnosis Date  . Diabetes mellitus   . Diabetes mellitus without complication   . VSD (ventricular septal defect)    Karen Byrd had no medications administered during this visit.  History   Social History  . Marital Status: Married    Spouse Name: N/A    Number of Children: N/A  . Years of Education: N/A   Occupational History  . Not on file.   Social History Main Topics  . Smoking status: Never Smoker   . Smokeless tobacco: Never Used  . Alcohol Use: Yes     Comment: occasional  . Drug Use: No  . Sexual Activity: Yes    Birth Control/ Protection: IUD   Other Topics Concern  . Not on file   Social History Narrative   ** Merged History Encounter **       Current Outpatient Prescriptions on File Prior to Visit  Medication Sig Dispense Refill  . butalbital-acetaminophen-caffeine  (FIORICET, ESGIC) 50-325-40 MG per tablet Take 1 tablet by mouth 2 (two) times daily as needed for headache. 30 tablet 0  . Dapsone 5 % topical gel Apply 1 application topically 2 (two) times daily.    Marland Kitchen glucose blood (ONE TOUCH ULTRA TEST) test strip Check blood sugars 6 times daily.  Use as instructed.    . Insulin Glargine (LANTUS SOLOSTAR) 100 UNIT/ML Solostar Pen Inject 28 Units into the skin every morning.    . Naftifine HCl 2 % CREA Apply a small amount of cream to foot once daily for two weeks. 60 g 5  . tretinoin (RETIN-A) 0.1 % cream Apply 1 application topically at bedtime.     No current facility-administered medications on file prior to visit.   Allergies  Allergen Reactions  . Sulfa Antibiotics Hives    "massive hives"     Assessment & Plan  Bilious vomiting & nausea:  Labs STAT: CBC, CMET, amylase, lipase, H. Pylori  Phenergan 12.5 MG PRN  If symptoms worsen, go to the ER.

## 2014-11-29 NOTE — Progress Notes (Signed)
Pre visit review using our clinic review tool, if applicable. No additional management support is needed unless otherwise documented below in the visit note. 

## 2014-11-29 NOTE — Patient Instructions (Signed)

## 2014-11-29 NOTE — Progress Notes (Signed)
HPI  Karen Byrd is a 34 y.o. female presenting with c/o nausea, vomiting and abdominal pain x 3 days. Nausea has improved some today, vomiting is intermittent and abdominal pain is constant. She hasn't been able to eat much; had 1/2 of a plain biscuit and 1 saltine cracker yesterday and threw up shortly after eating the cracker. Vomit is yellow and foamy. She reports fever of 101. Today temp is 98.2. She reports stomach pain is in the upper abdominal area. Pain feels like stabbing and twisting. Pain was 8 or 9/10 last night; but currently a 5/10. No sick contacts. Type 1 diabetes; sugars have been running in the 180s; has not taken insulin since symptoms started due to not eating. No history of pancreatitis; does not drink alcohol. Gallbladder and appendix removed at the same time x 10 yrs ago. No hx of acid reflux. No urinary problems. Started minocycline 1 month ago.   Past Medical History  Diagnosis Date  . Diabetes mellitus   . Diabetes mellitus without complication   . VSD (ventricular septal defect)    Ms. Joyner had no medications administered during this visit.  History   Social History  . Marital Status: Married    Spouse Name: N/A    Number of Children: N/A  . Years of Education: N/A   Occupational History  . Not on file.   Social History Main Topics  . Smoking status: Never Smoker   . Smokeless tobacco: Never Used  . Alcohol Use: Yes     Comment: occasional  . Drug Use: No  . Sexual Activity: Yes    Birth Control/ Protection: IUD   Other Topics Concern  . Not on file   Social History Narrative   ** Merged History Encounter **       Current Outpatient Prescriptions on File Prior to Visit  Medication Sig Dispense Refill  . butalbital-acetaminophen-caffeine (FIORICET, ESGIC) 50-325-40 MG per tablet Take 1 tablet by mouth 2 (two) times daily as needed for headache. 30 tablet 0  . Dapsone 5 % topical gel Apply 1 application topically 2 (two) times daily.    Marland Kitchen  glucose blood (ONE TOUCH ULTRA TEST) test strip Check blood sugars 6 times daily.  Use as instructed.    . Insulin Glargine (LANTUS SOLOSTAR) 100 UNIT/ML Solostar Pen Inject 28 Units into the skin every morning.    . Naftifine HCl 2 % CREA Apply a small amount of cream to foot once daily for two weeks. 60 g 5  . tretinoin (RETIN-A) 0.1 % cream Apply 1 application topically at bedtime.     No current facility-administered medications on file prior to visit.   Allergies  Allergen Reactions  . Sulfa Antibiotics Hives    "massive hives"    Past Medical History  Diagnosis Date  . Diabetes mellitus   . Diabetes mellitus without complication   . VSD (ventricular septal defect)     Current Outpatient Prescriptions  Medication Sig Dispense Refill  . butalbital-acetaminophen-caffeine (FIORICET, ESGIC) 50-325-40 MG per tablet Take 1 tablet by mouth 2 (two) times daily as needed for headache. 30 tablet 0  . clindamycin (CLEOCIN T) 1 % lotion Apply 1 application topically 2 (two) times daily.    . Dapsone 5 % topical gel Apply 1 application topically 2 (two) times daily.    Marland Kitchen glucose blood (ONE TOUCH ULTRA TEST) test strip Check blood sugars 6 times daily.  Use as instructed.    . Insulin Glargine (LANTUS SOLOSTAR) 100  UNIT/ML Solostar Pen Inject 28 Units into the skin every morning.    . minocycline (DYNACIN) 50 MG tablet Take 50 mg by mouth 2 (two) times daily.    . Naftifine HCl 2 % CREA Apply a small amount of cream to foot once daily for two weeks. 60 g 5  . NOVOLOG FLEXPEN 100 UNIT/ML FlexPen Inject 1-10 Units into the skin 3 (three) times daily with meals.   11  . tretinoin (RETIN-A) 0.1 % cream Apply 1 application topically at bedtime.    . promethazine (PHENERGAN) 12.5 MG tablet Take 1 tablet (12.5 mg total) by mouth every 6 (six) hours as needed for nausea or vomiting. 30 tablet 0   No current facility-administered medications for this visit.    Allergies  Allergen Reactions  .  Sulfa Antibiotics Hives    "massive hives"    Family History  Problem Relation Age of Onset  . Anesthesia problems Neg Hx   . Hypotension Neg Hx   . Malignant hyperthermia Neg Hx   . Pseudochol deficiency Neg Hx   . Hypertension Mother   . Diabetes Mother   . Hyperlipidemia Father   . Hypertension Father   . Heart disease Maternal Grandfather   . Diabetes Maternal Grandfather   . Stroke Maternal Grandfather   . Hyperlipidemia Paternal Grandmother   . Heart disease Paternal Grandmother   . Hypertension Paternal Grandmother   . Diabetes Paternal Grandmother   . Stroke Paternal Grandmother   . Hyperlipidemia Paternal Grandfather   . Hypertension Paternal Grandfather   . Stroke Paternal Grandfather   . Cancer Maternal Grandmother     leukemia    History   Social History  . Marital Status: Married    Spouse Name: N/A    Number of Children: N/A  . Years of Education: N/A   Occupational History  . Not on file.   Social History Main Topics  . Smoking status: Never Smoker   . Smokeless tobacco: Never Used  . Alcohol Use: Yes     Comment: occasional  . Drug Use: No  . Sexual Activity: Yes    Birth Control/ Protection: IUD   Other Topics Concern  . Not on file   Social History Narrative   ** Merged History Encounter **       Constitutional: Positive fever. Denies malaise, fatigue, headache or abrupt weight changes.  Respiratory: Denies difficulty breathing, shortness of breath, cough or sputum production.   Cardiovascular: Denies chest pain, chest tightness, palpitations or swelling in the hands or feet.  Gastrointestinal: Positive upper abdominal pain and vomiting. Denies bloating, constipation, diarrhea or blood in the stool.  GU: Denies urgency, frequency, pain with urination, burning sensation, blood in urine, odor or discharge. Skin: Denies redness, rashes, lesions or ulcercations.   No other specific complaints in a complete review of systems (except as  listed in HPI above).  BP 106/62 mmHg  Pulse 77  Temp(Src) 98.2 F (36.8 C) (Oral)  Wt 181 lb (82.101 kg)  SpO2 98%  LMP 11/25/2014 Wt Readings from Last 3 Encounters:  11/29/14 181 lb (82.101 kg)  08/24/14 181 lb (82.101 kg)  06/29/14 170 lb (77.111 kg)    General: Ms. Maresh is ill-appearing and uncomfortably sitting in the chair.  Skin: Warm, dry and intact. No rashes, lesions or ulcerations noted. Cardiovascular: Normal rate and rhythm. S1,S2 noted.  No murmur, rubs or gallops noted. No JVD or BLE edema. No carotid bruits noted. Pulmonary/Chest: Normal effort and positive  vesicular breath sounds. No respiratory distress. No wheezes, rales or ronchi noted.  Abdomen: Soft and non-distended. Tender to palpation in epigastric region, RUQ and LLQ. Normal bowel sounds, no bruits noted. No masses noted. Liver, spleen and kidneys non palpable. Neurological: Alert and oriented. Coordination normal.  Psychiatric: Mood and affect normal. Behavior is normal. Judgment and thought content normal.     BMET    Component Value Date/Time   NA 134* 11/29/2014 1450   K 3.7 11/29/2014 1450   CL 102 11/29/2014 1450   CO2 27 11/29/2014 1450   GLUCOSE 264* 11/29/2014 1450   BUN 7 11/29/2014 1450   CREATININE 0.58 11/29/2014 1450   CALCIUM 8.3* 11/29/2014 1450   GFRNONAA >90 04/29/2014 1156   GFRAA >90 04/29/2014 1156    Lipid Panel  No results found for: CHOL, TRIG, HDL, CHOLHDL, VLDL, LDLCALC  CBC    Component Value Date/Time   WBC 7.8 11/29/2014 1450   RBC 4.51 11/29/2014 1450   HGB 13.8 11/29/2014 1450   HCT 40.3 11/29/2014 1450   PLT 260.0 11/29/2014 1450   MCV 89.2 11/29/2014 1450   MCH 31.2 04/29/2014 1156   MCHC 34.3 11/29/2014 1450   RDW 13.6 11/29/2014 1450    Hgb A1C No results found for: HGBA1C    Assessment & Plan  Bilious vomiting & nausea:  Labs STAT: CBC, CMET, amylase, lipase, H. Pylori Phenergan 12.5 MG PRN If labs come back normal, consider CT  abdomen  If symptoms worsen, go to the ER.

## 2014-11-30 ENCOUNTER — Telehealth: Payer: Self-pay | Admitting: Internal Medicine

## 2014-11-30 ENCOUNTER — Other Ambulatory Visit: Payer: Self-pay | Admitting: Internal Medicine

## 2014-11-30 ENCOUNTER — Ambulatory Visit (INDEPENDENT_AMBULATORY_CARE_PROVIDER_SITE_OTHER)
Admission: RE | Admit: 2014-11-30 | Discharge: 2014-11-30 | Disposition: A | Discharge status: Home / Self Care | Payer: 59 | Source: Ambulatory Visit | Attending: Internal Medicine | Admitting: Internal Medicine

## 2014-11-30 DIAGNOSIS — R1110 Vomiting, unspecified: Secondary | ICD-10-CM

## 2014-11-30 DIAGNOSIS — R1114 Bilious vomiting: Secondary | ICD-10-CM

## 2014-11-30 DIAGNOSIS — R1010 Upper abdominal pain, unspecified: Secondary | ICD-10-CM

## 2014-11-30 MED ORDER — IOHEXOL 300 MG/ML  SOLN: 100.0000 mL | Freq: Once | INTRAMUSCULAR | Status: AC | PRN

## 2014-11-30 NOTE — Telephone Encounter (Signed)
Pt called checking on results of ct done today

## 2014-11-30 NOTE — Telephone Encounter (Signed)
Threasa Beards called and left her a message

## 2015-03-30 ENCOUNTER — Other Ambulatory Visit: Payer: Self-pay

## 2015-03-30 MED ORDER — INSULIN GLARGINE 100 UNIT/ML SOLOSTAR PEN: 28.0000 [IU] | PEN_INJECTOR | Freq: Every morning | SUBCUTANEOUS | Status: DC

## 2015-03-30 NOTE — Telephone Encounter (Signed)
Pt left v/m requesting new rx for lantus to [facility] [employer] on [address]; Webb Silversmith NP has never prescribed before; was getting from [place].Please advise.

## 2015-05-03 ENCOUNTER — Encounter: Payer: Self-pay | Admitting: Internal Medicine

## 2015-05-03 ENCOUNTER — Ambulatory Visit (INDEPENDENT_AMBULATORY_CARE_PROVIDER_SITE_OTHER): Payer: 59 | Admitting: Internal Medicine

## 2015-05-03 VITALS — BP 104/66 | HR 71 | Temp 98.0°F | Ht 71.0 in | Wt 197.0 lb

## 2015-05-03 DIAGNOSIS — Z0000 Encounter for general adult medical examination without abnormal findings: Secondary | ICD-10-CM

## 2015-05-03 DIAGNOSIS — E1065 Type 1 diabetes mellitus with hyperglycemia: Secondary | ICD-10-CM

## 2015-05-03 LAB — LIPID PANEL
CHOL/HDL RATIO: 3
Cholesterol: 194 mg/dL (ref 0–200)
HDL: 65.8 mg/dL (ref >39.00)
LDL CALC: 113 mg/dL — AB (ref 0–99)
NonHDL: 128.2
Triglycerides: 74 mg/dL (ref 0.0–149.0)
VLDL: 14.8 mg/dL (ref 0.0–40.0)

## 2015-05-03 LAB — COMPREHENSIVE METABOLIC PANEL
ALK PHOS: 42 U/L (ref 39–117)
ALT: 10 U/L (ref 0–35)
AST: 15 U/L (ref 0–37)
Albumin: 3.7 g/dL (ref 3.5–5.2)
BUN: 9 mg/dL (ref 6–23)
CHLORIDE: 102 meq/L (ref 96–112)
CO2: 27 meq/L (ref 19–32)
Calcium: 8.9 mg/dL (ref 8.4–10.5)
Creatinine, Ser: 0.58 mg/dL (ref 0.40–1.20)
GFR: 125.42 mL/min (ref >60.00)
Glucose, Bld: 246 mg/dL — ABNORMAL HIGH (ref 70–99)
Potassium: 4.1 mEq/L (ref 3.5–5.1)
Sodium: 134 mEq/L — ABNORMAL LOW (ref 135–145)
Total Bilirubin: 1 mg/dL (ref 0.2–1.2)
Total Protein: 6.4 g/dL (ref 6.0–8.3)

## 2015-05-03 LAB — HEMOGLOBIN A1C: Hgb A1c MFr Bld: 8.7 % — ABNORMAL HIGH (ref 4.6–6.5)

## 2015-05-03 LAB — MICROALBUMIN / CREATININE URINE RATIO
Creatinine,U: 163.8 mg/dL
Microalb Creat Ratio: 0.4 mg/g (ref 0.0–30.0)
Microalb, Ur: <0.7 mg/dL (ref 0.0–1.9)

## 2015-05-03 LAB — CBC
HEMATOCRIT: 39.2 % (ref 36.0–46.0)
Hemoglobin: 13.3 g/dL (ref 12.0–15.0)
MCHC: 33.9 g/dL (ref 30.0–36.0)
MCV: 92.8 fl (ref 78.0–100.0)
PLATELETS: 247 10*3/uL (ref 150.0–400.0)
RBC: 4.22 Mil/uL (ref 3.87–5.11)
RDW: 12.6 % (ref 11.5–15.5)
WBC: 6.2 10*3/uL (ref 4.0–10.5)

## 2015-05-03 NOTE — Progress Notes (Signed)
Pre visit review using our clinic review tool, if applicable. No additional management support is needed unless otherwise documented below in the visit note. 

## 2015-05-03 NOTE — Assessment & Plan Note (Signed)
Will check A1C and microalbumin today She does plan to continue to follow with [facility], she will let me know if she wants referral to an endocrinologist here in [facility] Encouraged her to consume a low carb diet, and try to eat at regular intervals Continue checking sugars 6 x day Continue current insulin regimen at this time

## 2015-05-03 NOTE — Addendum Note (Signed)
Addended by: Daralene Milch C on: 05/03/2015 11:49 AM   Modules accepted: SmartSet

## 2015-05-03 NOTE — Patient Instructions (Signed)

## 2015-05-03 NOTE — Progress Notes (Signed)
Subjective:    Patient ID: Karen Byrd, female    DOB: 1981-02-14, 34 y.o.   MRN: [id number]  HPI  Pt presents to the clinic today for her annual exam. She also needs to follow up DM 1. She follows with endocrinology but has not seen them since 11/2013. She checks her blood sugar 6 x day. Her BS ranges 34- 448. She is under a lot of stress right now and she is not eating regularly at all. She denies blurred vision, polydipsia, polyuria or numbness/tingling in hands or feet.  Flu: 2014 Pneumovax: not sure Tetanus: > 10 years ago LMP: IUD, 03/12/15, periods irregular Pap Smear: 2014, normal Mammogram 2015, normal Vision Screening: 12/2013 Dentist: as needed  Diet: Right now, she is eating whatever she can, whenever she can. It is usually fast food. Exercise: No current exercise  Review of Systems      Past Medical History  Diagnosis Date  . Diabetes mellitus   . Diabetes mellitus without complication   . VSD (ventricular septal defect)     Current Outpatient Prescriptions  Medication Sig Dispense Refill  . clindamycin (CLEOCIN T) 1 % lotion Apply 1 application topically 2 (two) times daily.    . Dapsone 5 % topical gel Apply 1 application topically 2 (two) times daily.    Marland Kitchen glucose blood (ONE TOUCH ULTRA TEST) test strip Check blood sugars 6 times daily.  Use as instructed.    . Insulin Glargine (LANTUS SOLOSTAR) 100 UNIT/ML Solostar Pen Inject 28 Units into the skin every morning. 15 mL 3  . Naftifine HCl 2 % CREA Apply a small amount of cream to foot once daily for two weeks. 60 g 5  . NOVOLOG FLEXPEN 100 UNIT/ML FlexPen Inject 1-10 Units into the skin 3 (three) times daily with meals.   11  . PARAGARD INTRAUTERINE COPPER IU 1 Device by Intrauterine route. Every 10 years--inserted 2014     No current facility-administered medications for this visit.    Allergies  Allergen Reactions  . Sulfa Antibiotics Hives    "massive hives"    Family History  Problem Relation  Age of Onset  . Anesthesia problems Neg Hx   . Hypotension Neg Hx   . Malignant hyperthermia Neg Hx   . Pseudochol deficiency Neg Hx   . Hypertension Mother   . Diabetes Mother   . Hyperlipidemia Father   . Hypertension Father   . Heart disease Maternal Grandfather   . Diabetes Maternal Grandfather   . Stroke Maternal Grandfather   . Hyperlipidemia Paternal Grandmother   . Heart disease Paternal Grandmother   . Hypertension Paternal Grandmother   . Diabetes Paternal Grandmother   . Stroke Paternal Grandmother   . Hyperlipidemia Paternal Grandfather   . Hypertension Paternal Grandfather   . Stroke Paternal Grandfather   . Cancer Maternal Grandmother     leukemia    History   Social History  . Marital Status: Married    Spouse Name: N/A  . Number of Children: N/A  . Years of Education: N/A   Occupational History  . Not on file.   Social History Main Topics  . Smoking status: Never Smoker   . Smokeless tobacco: Never Used  . Alcohol Use: Yes     Comment: occasional  . Drug Use: No  . Sexual Activity: Yes    Birth Control/ Protection: IUD   Other Topics Concern  . Not on file   Social History Narrative   **  Merged History Encounter **         Constitutional: Pt reports fatigue. Denies fever, malaise, headache or abrupt weight changes.  HEENT: Denies eye pain, eye redness, ear pain, ringing in the ears, wax buildup, runny nose, nasal congestion, bloody nose, or sore throat. Respiratory: Denies difficulty breathing, shortness of breath, cough or sputum production.   Cardiovascular: Denies chest pain, chest tightness, palpitations or swelling in the hands or feet.  Gastrointestinal: Pt reports nausea. Denies abdominal pain, bloating, constipation, diarrhea or blood in the stool.  GU: Denies urgency, frequency, pain with urination, burning sensation, blood in urine, odor or discharge. Musculoskeletal: Denies decrease in range of motion, difficulty with gait, muscle  pain or joint pain and swelling.  Skin: Denies redness, rashes, lesions or ulcercations.  Neurological: Denies dizziness, difficulty with memory, difficulty with speech or problems with balance and coordination.  Psych: Denies anxiety, depression, SI/HI.  No other specific complaints in a complete review of systems (except as listed in HPI above).  Objective:   Physical Exam   BP 104/66 mmHg  Pulse 71  Temp(Src) 98 F (36.7 C) (Oral)  Ht 5\' 11"  (1.803 m)  Wt 197 lb (89.359 kg)  BMI 27.49 kg/m2  SpO2 98%  LMP 03/12/2015 Wt Readings from Last 3 Encounters:  05/03/15 197 lb (89.359 kg)  11/29/14 181 lb (82.101 kg)  08/24/14 181 lb (82.101 kg)    General: Appears her stated age, well developed, well nourished in NAD. Skin: Warm, dry and intact. No rashes, or ulcerations noted. Spider veins noted of BLE. HEENT: Head: normal shape and size; Eyes: sclera white, no icterus, conjunctiva pink, PERRLA and EOMs intact; Ears: Tm's gray and intact, normal light reflex; Throat/Mouth: Teeth present, mucosa pink and moist, no exudate, lesions or ulcerations noted.  Neck: Neck supple, trachea midline. No masses, lumps or thyromegaly present.  Cardiovascular: Normal rate and rhythm. S1,S2 noted.  No murmur, rubs or gallops noted. No JVD or BLE edema. No carotid bruits noted. Pulmonary/Chest: Normal effort and positive vesicular breath sounds. No respiratory distress. No wheezes, rales or ronchi noted.  Abdomen: Soft and nontender. Normal bowel sounds, no bruits noted. No distention or masses noted. Liver, spleen and kidneys non palpable. Musculoskeletal: Normal range of motion. Strength 5/5 BUE/BLE. No difficulty with gait.  Neurological: Alert and oriented. Cranial nerves II-XII grossly intact. Coordination normal.  Psychiatric: Mood and affect normal. Behavior is normal. Judgment and thought content normal.    BMET    Component Value Date/Time   NA 134* 11/29/2014 1450   K 3.7 11/29/2014  1450   CL 102 11/29/2014 1450   CO2 27 11/29/2014 1450   GLUCOSE 264* 11/29/2014 1450   BUN 7 11/29/2014 1450   CREATININE 0.58 11/29/2014 1450   CALCIUM 8.3* 11/29/2014 1450   GFRNONAA >90 04/29/2014 1156   GFRAA >90 04/29/2014 1156    Lipid Panel  No results found for: CHOL, TRIG, HDL, CHOLHDL, VLDL, LDLCALC  CBC    Component Value Date/Time   WBC 7.8 11/29/2014 1450   RBC 4.51 11/29/2014 1450   HGB 13.8 11/29/2014 1450   HCT 40.3 11/29/2014 1450   PLT 260.0 11/29/2014 1450   MCV 89.2 11/29/2014 1450   MCH 31.2 04/29/2014 1156   MCHC 34.3 11/29/2014 1450   RDW 13.6 11/29/2014 1450    Hgb A1C No results found for: HGBA1C      Assessment & Plan:   Preventative Health Maintenance:  Encouraged her to get a flu shot in  the fall She declines Tdap today Will search Care Everywhere for [facility] records of pneumonia vaccine Pap smear due in 2017 Mammogram due in 5 years Will check CBC, CMET and Lipid profile today Encouraged her to see an eye doctor and dentist on an annual basis Encouraged her to work on diet and exercise  RTC in 1 year or sooner if needed

## 2015-05-04 ENCOUNTER — Encounter: Payer: Self-pay | Admitting: Internal Medicine

## 2015-07-22 ENCOUNTER — Other Ambulatory Visit: Payer: Self-pay

## 2015-07-22 MED ORDER — NOVOLOG FLEXPEN 100 UNIT/ML ~~LOC~~ SOPN: 1.0000 [IU] | PEN_INJECTOR | Freq: Three times a day (TID) | SUBCUTANEOUS | Status: DC

## 2015-07-22 NOTE — Telephone Encounter (Signed)
Pt last seen 05/03/15 and advised to continue same insulin regimen; pt being followed by [facility] but pt does not have appt with [facility] until 08/2015.pt taking novolog flex pen sliding scale and is out of med. Refill 5 pens x 1 refill until see Endo. Pt voiced understanding.[facility] [employer] [address].

## 2015-08-16 ENCOUNTER — Ambulatory Visit: Payer: Self-pay | Admitting: Internal Medicine

## 2015-08-29 ENCOUNTER — Ambulatory Visit: Payer: Self-pay | Admitting: Internal Medicine

## 2015-08-29 DIAGNOSIS — Z0289 Encounter for other administrative examinations: Secondary | ICD-10-CM

## 2015-08-31 ENCOUNTER — Telehealth: Payer: Self-pay | Admitting: Internal Medicine

## 2015-08-31 NOTE — Telephone Encounter (Signed)
Pt did not come in for their appt on 08/29/15 for an office visit. Please let me know if pt needs to be contacted immediately for follow up or no follow up needed. Best phone number to contact pt is [phone].

## 2015-08-31 NOTE — Telephone Encounter (Signed)
No need to reschedule.

## 2015-09-26 ENCOUNTER — Other Ambulatory Visit: Payer: Self-pay | Admitting: Internal Medicine

## 2015-10-10 ENCOUNTER — Encounter: Payer: Self-pay | Admitting: Internal Medicine

## 2015-10-10 ENCOUNTER — Ambulatory Visit (INDEPENDENT_AMBULATORY_CARE_PROVIDER_SITE_OTHER): Payer: 59 | Admitting: Internal Medicine

## 2015-10-10 ENCOUNTER — Ambulatory Visit (INDEPENDENT_AMBULATORY_CARE_PROVIDER_SITE_OTHER)
Admission: RE | Admit: 2015-10-10 | Discharge: 2015-10-10 | Disposition: A | Discharge status: Home / Self Care | Payer: 59 | Source: Ambulatory Visit | Attending: Internal Medicine | Admitting: Internal Medicine

## 2015-10-10 VITALS — BP 106/68 | HR 75 | Temp 98.0°F | Resp 12 | Ht 71.0 in | Wt 188.2 lb

## 2015-10-10 DIAGNOSIS — M5442 Lumbago with sciatica, left side: Secondary | ICD-10-CM | POA: Diagnosis not present

## 2015-10-10 DIAGNOSIS — M5441 Lumbago with sciatica, right side: Secondary | ICD-10-CM

## 2015-10-10 DIAGNOSIS — M549 Dorsalgia, unspecified: Secondary | ICD-10-CM

## 2015-10-10 MED ORDER — HYDROCODONE-ACETAMINOPHEN 5-325 MG PO TABS: 1.0000 | ORAL_TABLET | Freq: Four times a day (QID) | ORAL | Status: DC | PRN

## 2015-10-10 MED ORDER — CYCLOBENZAPRINE HCL 10 MG PO TABS: 10.0000 mg | ORAL_TABLET | Freq: Three times a day (TID) | ORAL | Status: DC | PRN

## 2015-10-10 NOTE — Progress Notes (Signed)
Pre-visit discussion using our clinic review tool. No additional management support is needed unless otherwise documented below in the visit note.  

## 2015-10-10 NOTE — Progress Notes (Signed)
Subjective:  Patient ID: Karen Byrd, female    DOB: [birth year]  Age: 34 y.o. MRN: [id number]  CC: The primary encounter diagnosis was Bilateral low back pain with sciatica, sciatica laterality unspecified. A diagnosis of Back pain, acute was also pertinent to this visit.  HPI VALA AVANCE presents for evaluation and treatment of back pain.  The pain occurred as the result of blunt trauma to lumbar spine which occurred during a fall. Patient was descending the icy cement steps that led to her company's parking garage on Friday   The hand rail was also icy.  Patient ducked her head to prevent head injury and her Lower spine hit the steps,   She developed large ecchymoses to both buttocks,  Left > right .  Pain has not improved.  And radiates to both buttocks.  Tailbone also hurting.   Since the fall on Friday,  She has stumbled twice due to right foot not clearing the floor.   No numbness or tingling.    Has been taking advil 800 mg every 6 hours.    Outpatient Prescriptions Prior to Visit  Medication Sig Dispense Refill  . clindamycin (CLEOCIN T) 1 % lotion Apply 1 application topically 2 (two) times daily.    . Dapsone 5 % topical gel Apply 1 application topically 2 (two) times daily.    Marland Kitchen glucose blood (ONE TOUCH ULTRA TEST) test strip Check blood sugars 6 times daily.  Use as instructed.    Marland Kitchen LANTUS SOLOSTAR 100 UNIT/ML Solostar Pen INJECT 28 UNITS INTO THE SKIN EVERY MORNING. 15 mL 3  . Naftifine HCl 2 % CREA Apply a small amount of cream to foot once daily for two weeks. 60 g 5  . NOVOLOG FLEXPEN 100 UNIT/ML FlexPen Inject 1-10 Units into the skin 3 (three) times daily with meals. 15 mL 1  . PARAGARD INTRAUTERINE COPPER IU 1 Device by Intrauterine route. Every 10 years--inserted 2014     No facility-administered medications prior to visit.    Review of Systems;  Patient denies headache, fevers, malaise, unintentional weight loss, skin rash, eye pain, sinus congestion and  sinus pain, sore throat, dysphagia,  hemoptysis , cough, dyspnea, wheezing, chest pain, palpitations, orthopnea, edema, abdominal pain, nausea, melena, diarrhea, constipation, flank pain, dysuria, hematuria, urinary  Frequency, nocturia, numbness, tingling, seizures,  Focal weakness, Loss of consciousness,  Tremor, insomnia, depression, anxiety, and suicidal ideation.      Objective:  BP 106/68 mmHg  Pulse 75  Temp(Src) 98 F (36.7 C) (Oral)  Resp 12  Ht 5\' 11"  (1.803 m)  Wt 188 lb 4 oz (85.39 kg)  BMI 26.27 kg/m2  SpO2 99%  LMP 09/29/2015  BP Readings from Last 3 Encounters:  10/10/15 106/68  05/03/15 104/66  11/29/14 106/62    Wt Readings from Last 3 Encounters:  10/10/15 188 lb 4 oz (85.39 kg)  05/03/15 197 lb (89.359 kg)  11/29/14 181 lb (82.101 kg)    General appearance: alert, cooperative and appears stated age Neck: symmetrical, trachea midline no neck tenderness. Back: symmetric, no curvature. ROM restricted due to pain at lower lumbar spine  Lungs: clear to auscultation bilaterally Heart: regular rate and rhythm, S1, S2 normal, no murmur, click, rub or gallop Abdomen: soft, non-tender; bowel sounds normal; no masses,  no organomegaly Pulses: 2+ and symmetric Skin: Skin color, texture, turgor normal. Bilateral ecchymose uf buttocks  Lymph nodes: Cervical, supraclavicular, and axillary nodes normal. Neuro: CNs 2-12 intact. DTRs 2+/4 in biceps,  brachioradialis, patellars and achilles. Muscle strength 5/5 in upper and lower exremities. Fine resting tremor bilaterally both hands cerebellar function normal. Romberg negative.  No pronator drift.   Gait normal.   Lab Results  Component Value Date   HGBA1C 8.7* 05/03/2015    Lab Results  Component Value Date   CREATININE 0.58 05/03/2015   CREATININE 0.58 11/29/2014   CREATININE 0.7 08/25/2014    Lab Results  Component Value Date   WBC 6.2 05/03/2015   HGB 13.3 05/03/2015   HCT 39.2 05/03/2015   PLT 247.0  05/03/2015   GLUCOSE 246* 05/03/2015   CHOL 194 05/03/2015   TRIG 74.0 05/03/2015   HDL 65.80 05/03/2015   LDLCALC 113* 05/03/2015   ALT 10 05/03/2015   AST 15 05/03/2015   NA 134* 05/03/2015   K 4.1 05/03/2015   CL 102 05/03/2015   CREATININE 0.58 05/03/2015   BUN 9 05/03/2015   CO2 27 05/03/2015   TSH 0.82 08/25/2014   HGBA1C 8.7* 05/03/2015   MICROALBUR <0.7 05/03/2015    Ct Abdomen Pelvis Wo Contrast  11/30/2014  CLINICAL DATA:  Four-day history of ileus vomiting. Nausea, fever. Right upper quadrant pain. EXAM: CT ABDOMEN AND PELVIS WITHOUT CONTRAST TECHNIQUE: Multidetector CT imaging of the abdomen and pelvis was performed following the standard protocol without IV contrast. COMPARISON:  None. FINDINGS: Lung bases are clear.  No effusions.  Heart is normal size. Prior cholecystectomy. Liver, spleen, pancreas, adrenals and kidneys have an unremarkable unenhanced appearance. No renal or ureteral stones. No hydronephrosis. Urinary bladder is unremarkable. Multiple calcifications in the pelvis felt represent phleboliths. Uterus is retroverted. IUD is in place. Small amount of free fluid in the cul-de-sac. No adnexal masses. No biliary ductal dilatation. Stomach and small bowel are decompressed. The right colon is in the midline or left of midline with small bowel loops to the right of the colon. Findings are compatible with some degree of malrotation, but no complicating feature. No free air or adenopathy. Aorta is normal caliber. No acute bony abnormality or focal bone lesion. 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 However, no complicating feature. No obstruction. No acute findings on this unenhanced study. Electronically Signed   By: Rolm Baptise M.D.   On: 11/30/2014 15:03    Assessment & Plan:   Problem List Items Addressed This Visit    Back pain, acute    Secondary to  blunt trauma during fall on icy steps last Friday Dec 16 while leaving work .  Plain films were done to rule out vertebral and sacral fractures.  Vicodin prescribed.       Relevant Medications   HYDROcodone-acetaminophen (NORCO/VICODIN) 5-325 MG tablet   cyclobenzaprine (FLEXERIL) 10 MG tablet    Other Visit Diagnoses    Bilateral low back pain with sciatica, sciatica laterality unspecified    -  Primary    Relevant Medications    HYDROcodone-acetaminophen (NORCO/VICODIN) 5-325 MG tablet    cyclobenzaprine (FLEXERIL) 10 MG tablet    Other Relevant Orders    DG Lumbar Spine 2-3 Views (Completed)    DG Sacrum/Coccyx (Completed)       I am having Ms. Scally start on HYDROcodone-acetaminophen and cyclobenzaprine. I am also having her maintain her Naftifine HCl, Dapsone, glucose blood, clindamycin, PARAGARD INTRAUTERINE COPPER IU, NOVOLOG FLEXPEN, and LANTUS SOLOSTAR.  Meds ordered this encounter  Medications  .  HYDROcodone-acetaminophen (NORCO/VICODIN) 5-325 MG tablet    Sig: Take 1 tablet by mouth every 6 (six) hours as needed for moderate pain.    Dispense:  60 tablet    Refill:  0  . cyclobenzaprine (FLEXERIL) 10 MG tablet    Sig: Take 1 tablet (10 mg total) by mouth 3 (three) times daily as needed for muscle spasms.    Dispense:  30 tablet    Refill:  0    There are no discontinued medications.  Follow-up: No Follow-up on file.   Crecencio Mc, MD

## 2015-10-10 NOTE — Patient Instructions (Addendum)
Please reduce your ibuprofen to 800 mg 3 times daily.  (2400 mg max daily dose)  I have added flexeril,  Muscle relaxer,  And vicodin to take at night or for severe daytime pain   Take a laxative to prevent constipation  Plain films to rule out sacral and lumbar vertebral fracture.   Ok to use ice for 15 minutes a few times daily for the first few days, then heat may help more ,  But you can alternate

## 2015-10-11 DIAGNOSIS — M549 Dorsalgia, unspecified: Secondary | ICD-10-CM | POA: Insufficient documentation

## 2015-10-11 NOTE — Assessment & Plan Note (Signed)
Secondary to blunt trauma during fall on icy steps last Friday Dec 16 while leaving work .  Plain films were done to rule out vertebral and sacral fractures.  Vicodin prescribed.

## 2015-11-03 ENCOUNTER — Encounter: Payer: Self-pay | Admitting: Family Medicine

## 2015-11-03 ENCOUNTER — Ambulatory Visit (INDEPENDENT_AMBULATORY_CARE_PROVIDER_SITE_OTHER): Payer: 59 | Admitting: Family Medicine

## 2015-11-03 VITALS — BP 110/62 | HR 90 | Temp 98.1°F | Wt 185.6 lb

## 2015-11-03 DIAGNOSIS — R319 Hematuria, unspecified: Secondary | ICD-10-CM | POA: Diagnosis not present

## 2015-11-03 DIAGNOSIS — R059 Cough, unspecified: Secondary | ICD-10-CM

## 2015-11-03 DIAGNOSIS — R05 Cough: Secondary | ICD-10-CM | POA: Diagnosis not present

## 2015-11-03 DIAGNOSIS — E109 Type 1 diabetes mellitus without complications: Secondary | ICD-10-CM | POA: Diagnosis not present

## 2015-11-03 DIAGNOSIS — R69 Illness, unspecified: Secondary | ICD-10-CM

## 2015-11-03 DIAGNOSIS — J111 Influenza due to unidentified influenza virus with other respiratory manifestations: Secondary | ICD-10-CM | POA: Diagnosis not present

## 2015-11-03 LAB — URINALYSIS
BILIRUBIN URINE: NEGATIVE
Ketones, ur: NEGATIVE
LEUKOCYTES UA: NEGATIVE
NITRITE: NEGATIVE
PH: 6 (ref 5.0–8.0)
Specific Gravity, Urine: 1.01 (ref 1.000–1.030)
Total Protein, Urine: NEGATIVE
Urine Glucose: 500 — AB
Urobilinogen, UA: 0.2 (ref 0.0–1.0)

## 2015-11-03 LAB — POCT URINALYSIS DIPSTICK
Bilirubin, UA: NEGATIVE
Ketones, UA: NEGATIVE
NITRITE UA: NEGATIVE
PH UA: 6
Protein, UA: NEGATIVE
Spec Grav, UA: 1.01
UROBILINOGEN UA: 0.2

## 2015-11-03 LAB — BASIC METABOLIC PANEL
BUN: 10 mg/dL (ref 6–23)
CO2: 29 mEq/L (ref 19–32)
Calcium: 9 mg/dL (ref 8.4–10.5)
Chloride: 99 mEq/L (ref 96–112)
Creatinine, Ser: 0.63 mg/dL (ref 0.40–1.20)
GFR: 113.67 mL/min (ref >60.00)
GLUCOSE: 321 mg/dL — AB (ref 70–99)
Potassium: 4.5 mEq/L (ref 3.5–5.1)
Sodium: 135 mEq/L (ref 135–145)

## 2015-11-03 LAB — POCT INFLUENZA A/B
INFLUENZA A, POC: NEGATIVE
Influenza B, POC: NEGATIVE

## 2015-11-03 MED ORDER — OSELTAMIVIR PHOSPHATE 75 MG PO CAPS: 75.0000 mg | ORAL_CAPSULE | Freq: Two times a day (BID) | ORAL | Status: DC

## 2015-11-03 NOTE — Patient Instructions (Signed)
Nice to meet you. Your illnesses concerning for influenza, though could be another viral illness. Given her history of diabetes we will treat you with Tamiflu for this. Please continue to monitor your blood sugar. Please continue to use the sliding scale insulin. If your persistently having blood sugars greater than 350 please let us know. You should call us if you develop ketones in her urine. If you develop nausea, vomiting, lethargy, increased urination, increased thirst, abdominal pain, or any new or change in symptoms please seek medical attention.

## 2015-11-03 NOTE — Progress Notes (Addendum)
Patient ID: Karen Byrd, female   DOB: September 08, 1981, 35 y.o.   MRN: [id number]  Tommi Rumps, MD Phone: [phone]  Karen Byrd is a 35 y.o. female who presents today for same-day visit.  Patient notes onset of symptoms on Sunday. Developed rhinorrhea, watery itchy eyes, tiredness and achiness, minimal amount of sinus pressure and cough productive of clear sputum. No shortness of breath. Fever 103F yesterday. She notes possible sick contacts at work. She is taking over-the-counter Alka-Seltzer cold and flu and a nasal spray with minimal benefit. She does note her blood glucoses have been running elevated. Anywhere from 300s to 400s. She takes 31 units of Lantus daily and uses a sliding scale of NovoLog that is 1 unit for every 50 above 150 and 1 unit per 15 g of carbs. She notes she has increased her sliding scale up to 12 units when her blood sugar gets into the 400s and she is able to decrease her blood glucose down into the 100s with this. Denies any hypoglycemia. She also notes she is checking her urine for ketones at home and she's not had any ketones. Denies abdominal pain, nausea, and vomiting. She's not ever had DKA.  PMH: nonsmoker.   ROS see history of present illness  Objective  Physical Exam Filed Vitals:   11/03/15 1324  BP: 110/62  Pulse: 90  Temp: 98.1 F (36.7 C)    BP Readings from Last 3 Encounters:  11/03/15 110/62  10/10/15 106/68  05/03/15 104/66   Wt Readings from Last 3 Encounters:  11/03/15 185 lb 9.6 oz (84.188 kg)  10/10/15 188 lb 4 oz (85.39 kg)  05/03/15 197 lb (89.359 kg)    Physical Exam  Constitutional: She is well-developed, well-nourished, and in no distress.  HENT:  Head: Normocephalic and atraumatic.  Right Ear: External ear normal.  Left Ear: External ear normal.  Mouth/Throat: No oropharyngeal exudate.  TMs bilaterally, mild posterior oropharyngeal erythema, clear rhinorrhea  Eyes: Conjunctivae are normal. Pupils are equal,  round, and reactive to light.  Neck: Neck supple.  Cardiovascular: Normal rate, regular rhythm and normal heart sounds.  Exam reveals no gallop and no friction rub.   No murmur heard. Pulmonary/Chest: Effort normal and breath sounds normal. No respiratory distress. She has no wheezes. She has no rales.  Abdominal: Soft. She exhibits no distension. There is no tenderness. There is no rebound and no guarding.  Musculoskeletal: She exhibits no edema.  Lymphadenopathy:    She has no cervical adenopathy.  Neurological: She is alert. Gait normal.  Skin: Skin is warm and dry. She is not diaphoretic.     Assessment/Plan: Please see individual problem list.  Influenza-like illness Patient's symptoms are highly consistent with influenza. Her rapid flu test was negative though. She has no focal findings of bacterial infection on exam. Given her history of diabetes we will proceed with treatment with Tamiflu. She will continue to closely monitor her blood sugars. She'll continue her sliding scale. We'll check a BMP today to evaluate her anion gap and blood glucose. Her urine did have large glucose, though did not have any ketones. She is given DKA precautions and advise if she developed any symptoms she needed to call us immediately. She will follow-up tomorrow. She is given return precautions.   with blood on urine dipstick we will send for urine micro-.  Orders Placed This Encounter  Procedures  . Basic Metabolic Panel (BMET)  . Urinalysis  . POCT Influenza A/B  . POCT  Urinalysis Dipstick    Meds ordered this encounter  Medications  . oseltamivir (TAMIFLU) 75 MG capsule    Sig: Take 1 capsule (75 mg total) by mouth 2 (two) times daily.    Dispense:  10 capsule    Refill:  0    Tommi Rumps

## 2015-11-03 NOTE — Assessment & Plan Note (Addendum)
Patient's symptoms are highly consistent with influenza. Her rapid flu test was negative though. She has no focal findings of bacterial infection on exam. Given her history of diabetes we will proceed with treatment with Tamiflu. She will continue to closely monitor her blood sugars. She'll continue her sliding scale. We'll check a BMP today to evaluate her anion gap and blood glucose. Her urine did have large glucose, though did not have any ketones. She is given DKA precautions and advise if she developed any symptoms she needed to call us immediately. She will follow-up tomorrow. She is given return precautions.

## 2015-11-03 NOTE — Progress Notes (Signed)
Pre visit review using our clinic review tool, if applicable. No additional management support is needed unless otherwise documented below in the visit note. 

## 2015-11-03 NOTE — Addendum Note (Signed)
Addended by: Caryl Bis ERIC G on: 11/03/2015 03:40 PM   Modules accepted: Orders

## 2015-11-04 ENCOUNTER — Ambulatory Visit: Payer: Self-pay | Admitting: Family Medicine

## 2015-11-22 ENCOUNTER — Telehealth: Payer: Self-pay | Admitting: Surgical

## 2015-11-22 NOTE — Telephone Encounter (Signed)
-----   Message from Leone Haven, MD sent at 11/22/2015 10:18 AM EST ----- Can you let the patient know that she needs to follow-up with her PCP regarding a trace amount of blood on her urine dipstick. They were unable to run a urine micro on her specimen the last time she was in the office to evaluate it for blood. Often times this will be negative on microscopy and just needs to be confirmed. Thanks. Randall Hiss.  ----- Message -----    From: SYSTEM    Sent: 11/08/2015  12:05 AM      To: Leone Haven, MD

## 2015-11-22 NOTE — Telephone Encounter (Signed)
LM for patient to return call.

## 2016-03-20 ENCOUNTER — Telehealth: Payer: Self-pay | Admitting: Internal Medicine

## 2016-03-20 ENCOUNTER — Emergency Department (HOSPITAL_COMMUNITY): Payer: 59

## 2016-03-20 ENCOUNTER — Encounter (HOSPITAL_COMMUNITY): Payer: Self-pay | Admitting: *Deleted

## 2016-03-20 ENCOUNTER — Emergency Department (HOSPITAL_COMMUNITY)
Admission: EM | Admit: 2016-03-20 | Discharge: 2016-03-20 | Disposition: A | Discharge status: Home / Self Care | Payer: 59 | Attending: Emergency Medicine | Admitting: Emergency Medicine

## 2016-03-20 DIAGNOSIS — E119 Type 2 diabetes mellitus without complications: Secondary | ICD-10-CM | POA: Diagnosis not present

## 2016-03-20 DIAGNOSIS — Q210 Ventricular septal defect: Secondary | ICD-10-CM | POA: Diagnosis not present

## 2016-03-20 DIAGNOSIS — R0602 Shortness of breath: Secondary | ICD-10-CM | POA: Insufficient documentation

## 2016-03-20 DIAGNOSIS — Z8774 Personal history of (corrected) congenital malformations of heart and circulatory system: Secondary | ICD-10-CM | POA: Insufficient documentation

## 2016-03-20 DIAGNOSIS — R0789 Other chest pain: Secondary | ICD-10-CM | POA: Insufficient documentation

## 2016-03-20 DIAGNOSIS — Z794 Long term (current) use of insulin: Secondary | ICD-10-CM | POA: Insufficient documentation

## 2016-03-20 DIAGNOSIS — R079 Chest pain, unspecified: Secondary | ICD-10-CM

## 2016-03-20 DIAGNOSIS — R1013 Epigastric pain: Secondary | ICD-10-CM | POA: Diagnosis not present

## 2016-03-20 LAB — BASIC METABOLIC PANEL
Anion gap: 7 (ref 5–15)
BUN: 6 mg/dL (ref 6–20)
CHLORIDE: 103 mmol/L (ref 101–111)
CO2: 24 mmol/L (ref 22–32)
CREATININE: 0.63 mg/dL (ref 0.44–1.00)
Calcium: 8.7 mg/dL — ABNORMAL LOW (ref 8.9–10.3)
GFR calc Af Amer: >60 mL/min (ref >60)
GLUCOSE: 360 mg/dL — AB (ref 65–99)
Potassium: 4.1 mmol/L (ref 3.5–5.1)
SODIUM: 134 mmol/L — AB (ref 135–145)

## 2016-03-20 LAB — CBC
HCT: 40.3 % (ref 36.0–46.0)
Hemoglobin: 13.2 g/dL (ref 12.0–15.0)
MCH: 30.3 pg (ref 26.0–34.0)
MCHC: 32.8 g/dL (ref 30.0–36.0)
MCV: 92.6 fL (ref 78.0–100.0)
PLATELETS: 264 10*3/uL (ref 150–400)
RBC: 4.35 MIL/uL (ref 3.87–5.11)
RDW: 12.3 % (ref 11.5–15.5)
WBC: 6.4 10*3/uL (ref 4.0–10.5)

## 2016-03-20 LAB — I-STAT TROPONIN, ED: Troponin i, poc: 0 ng/mL (ref 0.00–0.08)

## 2016-03-20 MED ORDER — GI COCKTAIL ~~LOC~~
30.0000 mL | Freq: Once | ORAL | Status: AC
  Administered 2016-03-20: 30 mL via ORAL
  Filled 2016-03-20: qty 30

## 2016-03-20 NOTE — ED Notes (Signed)
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

## 2016-03-20 NOTE — Telephone Encounter (Signed)
Patient Name: Karen Byrd  DOB: 1980-12-10    Initial Comment Caller states she has been having pressure in chest for two weeks, getting progressively worse, also sharp shooting side pain   Nurse Assessment  Nurse: Andria Frames, RN, Aeriel Date/Time (Eastern Time): 03/20/2016 9:38:05 AM  Confirm and document reason for call. If symptomatic, describe symptoms. You must click the next button to save text entered. ---Caller states, she has been having pressure in her chest for 2 weeks, it is progressively getting worse. Pt is having pain that also runs through both of her sides that is crippling that has been going on for 3 weeks. Caller states, she currently is not having any difficulty breathing but feels like there is a big lump in her throat.  Has the patient traveled out of the country within the last 30 days? ---Not Applicable  Does the patient have any new or worsening symptoms? ---Yes  Will a triage be completed? ---Yes  Related visit to physician within the last 2 weeks? ---N/A  Does the PT have any chronic conditions? (i.e. diabetes, asthma, etc.) ---Unknown  Is the patient pregnant or possibly pregnant? (Ask all females between the ages of 21-55) ---No  Is this a behavioral health or substance abuse call? ---No     Guidelines    Guideline Title Affirmed Question Affirmed Notes  Chest Pain [1] Chest pain lasts > 5 minutes AND [2] described as crushing, pressure-like, or heavy    Final Disposition User   Call EMS 911 Now Hensel, RN, Aeriel    Disagree/Comply: Comply

## 2016-03-20 NOTE — ED Provider Notes (Signed)
CSN: [id number]     Arrival date & time 03/20/16  1043 History   First MD Initiated Contact with Patient 03/20/16 1110     Chief Complaint  Patient presents with  . Chest Pain     (Consider location/radiation/quality/duration/timing/severity/associated sxs/prior Treatment) Patient is a 35 y.o. female presenting with chest pain. The history is provided by the patient.  Chest Pain Pain location:  L chest Pain quality: crushing and pressure   Pain radiates to:  Does not radiate Pain radiates to the back: no   Pain severity:  Moderate Onset quality:  Gradual Duration:  2 weeks Timing:  Constant Progression:  Worsening Chronicity:  New Relieved by:  Nothing Worsened by:  Nothing tried Ineffective treatments:  None tried Associated symptoms: abdominal pain (epigastric) and shortness of breath   Associated symptoms: no dizziness, no fever, no headache, no nausea, no palpitations and not vomiting    35 yo F With a chief complaint of left-sided chest pain. This feels like a pressure and is located in one spot to her left chest. Nothing seems to make this better or worse. She has had some significant increase of her reflux disease over the past week as well. Denies any lower extremity edema denies history of DVT or PE. Denies any other PE risk factors. 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 Has had no fevers cough or congestion. Denies any lower extremity edema. Denies radiation of pain. Denies exertional symptoms.  Past Medical History  Diagnosis Date  . Diabetes mellitus   . Diabetes mellitus without complication ([place])   . VSD (ventricular septal defect)    Past Surgical History  Procedure Laterality Date  . Vsd repair  May 2000  . Appendectomy    . Tonsillectomy    . Vsd repair    . Cholecystectomy    . Dilation and curettage of uterus  2014   Family History  Problem Relation Age of Onset  . Anesthesia problems Neg Hx   . Hypotension Neg Hx   .  Malignant hyperthermia Neg Hx   . Pseudochol deficiency Neg Hx   . Hypertension Mother   . Diabetes Mother   . Hyperlipidemia Father   . Hypertension Father   . Heart disease Maternal Grandfather   . Diabetes Maternal Grandfather   . Stroke Maternal Grandfather   . Hyperlipidemia Paternal Grandmother   . Heart disease Paternal Grandmother   . Hypertension Paternal Grandmother   . Diabetes Paternal Grandmother   . Stroke Paternal Grandmother   . Hyperlipidemia Paternal Grandfather   . Hypertension Paternal Grandfather   . Stroke Paternal Grandfather   . Cancer Maternal Grandmother     leukemia   Social History  Substance Use Topics  . Smoking status: Never Smoker   . Smokeless tobacco: Never Used  . Alcohol Use: Yes     Comment: occasional   OB History    Gravida Para Term Preterm AB TAB SAB Ectopic Multiple Living   2 0 0 0 1 0 1 0 0 0      Review of Systems  Constitutional: Negative for fever and chills.  HENT: Negative for congestion and rhinorrhea.   Eyes: Negative for redness and visual disturbance.  Respiratory: Positive for shortness of breath. Negative for wheezing.   Cardiovascular: Negative for chest pain and palpitations.  Gastrointestinal: Positive for abdominal pain (epigastric). Negative for nausea, vomiting and diarrhea.  Genitourinary: Negative for dysuria and urgency.  Musculoskeletal: Negative for myalgias  and arthralgias.  Skin: Negative for pallor and wound.  Neurological: Negative for dizziness and headaches.      Allergies  Sulfa antibiotics  Home Medications   Prior to Admission medications   Medication Sig Start Date End Date Taking? Authorizing Provider  LANTUS SOLOSTAR 100 UNIT/ML Solostar Pen INJECT 28 UNITS INTO THE SKIN EVERY MORNING. Patient taking differently: INJECT 31 UNITS INTO THE SKIN EVERY MORNING. 09/26/15  Yes Jearld Fenton, NP  NOVOLOG FLEXPEN 100 UNIT/ML FlexPen Inject 1-10 Units into the skin 3 (three) times daily with  meals. 07/22/15  Yes Jearld Fenton, NP  PARAGARD INTRAUTERINE COPPER IU 1 Device by Intrauterine route. Every 10 years--inserted 2014   Yes Historical Provider, MD   BP 145/72 mmHg  Pulse 87  Temp(Src) 98.1 F (36.7 C) (Oral)  Resp 18  Ht 5\' 11"  (1.803 m)  Wt 189 lb 1 oz (85.758 kg)  BMI 26.38 kg/m2  SpO2 100%  LMP 03/20/2016 Physical Exam  Constitutional: She is oriented to person, place, and time. She appears well-developed and well-nourished. No distress.  HENT:  Head: Normocephalic and atraumatic.  Eyes: EOM are normal. Pupils are equal, round, and reactive to light.  Neck: Normal range of motion. Neck supple.  Cardiovascular: Normal rate and regular rhythm.  Exam reveals no gallop and no friction rub.   No murmur heard. Pulmonary/Chest: Effort normal. She has no wheezes. She has no rales. She exhibits no tenderness.  Abdominal: Soft. She exhibits no distension. There is tenderness (mild epigastric). There is no rebound and no guarding.  Musculoskeletal: She exhibits no edema or tenderness.  Neurological: She is alert and oriented to person, place, and time.  Skin: Skin is warm and dry. She is not diaphoretic.  Psychiatric: She has a normal mood and affect. Her behavior is normal.  Nursing note and vitals reviewed.   ED Course  Procedures (including critical care time) Labs Review Labs Reviewed  BASIC METABOLIC PANEL - Abnormal; Notable for the following:    Sodium 134 (*)    Glucose, Bld 360 (*)    Calcium 8.7 (*)    All other components within normal limits  CBC  I-STAT TROPOININ, ED    Imaging Review Dg Chest 2 View  03/20/2016  CLINICAL DATA:  Left-sided chest pressure and shortness of breath for several weeks EXAM: CHEST  2 VIEW COMPARISON:  None. FINDINGS: Cardiac shadow is within normal limits. Postsurgical changes are seen. The lungs are clear bilaterally. No sizable effusion or pneumothorax is noted. No bony abnormality is seen. IMPRESSION: No acute  abnormality noted. Electronically Signed   By: Inez Catalina M.D.   On: 03/20/2016 11:47   I have personally reviewed and evaluated these images and lab results as part of my medical decision-making.   EKG Interpretation   Date/Time:  Tuesday Mar 20 2016 10:53:34 EDT Ventricular Rate:  89 PR Interval:  206 QRS Duration: 84 QT Interval:  394 QTC Calculation: 479 R Axis:   6 Text Interpretation:  Normal sinus rhythm Normal ECG No significant change  since last tracing Confirmed by Khamya Topp MD, DANIEL [id number]) on 03/20/2016  11:03:02 AM      MDM   Final diagnoses:  Chest pain, unspecified chest pain type    35 yo F With a chief complaints of chest pain. Atypical in nature some aspect of it is reproduced with palpation of the epigastrium. Patient has had increased reflux as well. Patient's EKG chest x-ray and troponin are unremarkable. Feel that  this is unlikely to be ACS with patient age. There is a family history of an MI in their late 55s. No young MIs. PERC negative. Shared decision-making at bedside patient currently electing to not have second troponin performed. We'll have her follow-up with her family physician in a couple days. Suspect this is reflux disease based on history.  11:56 AM:  I have discussed the diagnosis/risks/treatment options with the patient and family and believe the pt to be eligible for discharge home to follow-up with PCP. We also discussed returning to the ED immediately if new or worsening sx occur. We discussed the sx which are most concerning (e.g., sudden worsening pain, fever, inability to tolerate by mouth) that necessitate immediate return. Medications administered to the patient during their visit and any new prescriptions provided to the patient are listed below.  Medications given during this visit Medications  gi cocktail (Maalox,Lidocaine,Donnatal) (30 mLs Oral Given 03/20/16 1145)    New Prescriptions   No medications on file    The patient  appears reasonably screen and/or stabilized for discharge and I doubt any other medical condition or other [facility] requiring further screening, evaluation, or treatment in the ED at this time prior to discharge.      Deno Etienne, DO 03/20/16 1156

## 2016-03-20 NOTE — ED Notes (Signed)
Pt ambulates independently and with steady gait at time of discharge. Discharge instructions and follow up information reviewed with patient. No other questions or concerns voiced at this time.  

## 2016-03-20 NOTE — Telephone Encounter (Signed)
Agree, UC or ER if no appt today

## 2016-03-20 NOTE — Discharge Instructions (Signed)
Try Zantac 150 mg twice a day. Things that typically make reflux worse are: alcohol tobacco spicy foods tomato-based products fatty foods. Try to avoid these as you can. Follow with your family doctor. Nonspecific Chest Pain  Chest pain can be caused by many different conditions. There is always a chance that your pain could be related to something serious, such as a heart attack or a blood clot in your lungs. Chest pain can also be caused by conditions that are not life-threatening. If you have chest pain, it is very important to follow up with your health care provider. CAUSES  Chest pain can be caused by:  Heartburn.  Pneumonia or bronchitis.  Anxiety or stress.  Inflammation around your heart (pericarditis) or lung (pleuritis or pleurisy).  A blood clot in your lung.  A collapsed lung (pneumothorax). It can develop suddenly on its own (spontaneous pneumothorax) or from trauma to the chest.  Shingles infection (varicella-zoster virus).  Heart attack.  Damage to the bones, muscles, and cartilage that make up your chest wall. This can include:  Bruised bones due to injury.  Strained muscles or cartilage due to frequent or repeated coughing or overwork.  Fracture to one or more ribs.  Sore cartilage due to inflammation (costochondritis). RISK FACTORS  Risk factors for chest pain may include:  Activities that increase your risk for trauma or injury to your chest.  Respiratory infections or conditions that cause frequent coughing.  Medical conditions or overeating that can cause heartburn.  Heart disease or family history of heart disease.  Conditions or health behaviors that increase your risk of developing a blood clot.  Having had chicken pox (varicella zoster). SIGNS AND SYMPTOMS Chest pain can feel like:  Burning or tingling on the surface of your chest or deep in your chest.  Crushing, pressure, aching, or squeezing pain.  Dull or sharp pain that is worse when  you move, cough, or take a deep breath.  Pain that is also felt in your back, neck, shoulder, or arm, or pain that spreads to any of these areas. Your chest pain may come and go, or it may stay constant. DIAGNOSIS Lab tests or other studies may be needed to find the cause of your pain. Your health care provider may have you take a test called an ambulatory ECG (electrocardiogram). An ECG records your heartbeat patterns at the time the test is performed. You may also have other tests, such as:  Transthoracic echocardiogram (TTE). During echocardiography, sound waves are used to create a picture of all of the heart structures and to look at how blood flows through your heart.  Transesophageal echocardiogram (TEE).This is a more advanced imaging test that obtains images from inside your body. It allows your health care provider to see your heart in finer detail.  Cardiac monitoring. This allows your health care provider to monitor your heart rate and rhythm in real time.  Holter monitor. This is a portable device that records your heartbeat and can help to diagnose abnormal heartbeats. It allows your health care provider to track your heart activity for several days, if needed.  Stress tests. These can be done through exercise or by taking medicine that makes your heart beat more quickly.  Blood tests.  Imaging tests. TREATMENT  Your treatment depends on what is causing your chest pain. Treatment may include:  Medicines. These may include:  Acid blockers for heartburn.  Anti-inflammatory medicine.  Pain medicine for inflammatory conditions.  Antibiotic medicine, if an  infection is present.  Medicines to dissolve blood clots.  Medicines to treat coronary artery disease.  Supportive care for conditions that do not require medicines. This may include:  Resting.  Applying heat or cold packs to injured areas.  Limiting activities until pain decreases. HOME CARE INSTRUCTIONS  If  you were prescribed an antibiotic medicine, finish it all even if you start to feel better.  Avoid any activities that bring on chest pain.  Do not use any tobacco products, including cigarettes, chewing tobacco, or electronic cigarettes. If you need help quitting, ask your health care provider.  Do not drink alcohol.  Take medicines only as directed by your health care provider.  Keep all follow-up visits as directed by your health care provider. This is important. This includes any further testing if your chest pain does not go away.  If heartburn is the cause for your chest pain, you may be told to keep your head raised (elevated) while sleeping. This reduces the chance that acid will go from your stomach into your esophagus.  Make lifestyle changes as directed by your health care provider. These may include:  Getting regular exercise. Ask your health care provider to suggest some activities that are safe for you.  Eating a heart-healthy diet. A registered dietitian can help you to learn healthy eating options.  Maintaining a healthy weight.  Managing diabetes, if necessary.  Reducing stress. SEEK MEDICAL CARE IF:  Your chest pain does not go away after treatment.  You have a rash with blisters on your chest.  You have a fever. SEEK IMMEDIATE MEDICAL CARE IF:   Your chest pain is worse.  You have an increasing cough, or you cough up blood.  You have severe abdominal pain.  You have severe weakness.  You faint.  You have chills.  You have sudden, unexplained chest discomfort.  You have sudden, unexplained discomfort in your arms, back, neck, or jaw.  You have shortness of breath at any time.  You suddenly start to sweat, or your skin gets clammy.  You feel nauseous or you vomit.  You suddenly feel light-headed or dizzy.  Your heart begins to beat quickly, or it feels like it is skipping beats. These symptoms may represent a serious problem that is an  emergency. Do not wait to see if the symptoms will go away. Get medical help right away. Call your local emergency services (911 in the [place] Do not drive yourself to the hospital.   This information is not intended to replace advice given to you by your health care provider. Make sure you discuss any questions you have with your health care provider.   Document Released: 07/18/2005 Document Revised: 10/29/2014 Document Reviewed: 05/14/2014 Elsevier Interactive Patient Education [employer].

## 2016-03-22 ENCOUNTER — Encounter: Payer: Self-pay | Admitting: Internal Medicine

## 2016-03-22 ENCOUNTER — Ambulatory Visit (INDEPENDENT_AMBULATORY_CARE_PROVIDER_SITE_OTHER): Payer: 59 | Admitting: Internal Medicine

## 2016-03-22 VITALS — BP 112/76 | HR 80 | Temp 98.0°F | Wt 190.0 lb

## 2016-03-22 DIAGNOSIS — E1065 Type 1 diabetes mellitus with hyperglycemia: Secondary | ICD-10-CM

## 2016-03-22 DIAGNOSIS — R079 Chest pain, unspecified: Secondary | ICD-10-CM | POA: Diagnosis not present

## 2016-03-22 DIAGNOSIS — K5900 Constipation, unspecified: Secondary | ICD-10-CM

## 2016-03-22 DIAGNOSIS — R1013 Epigastric pain: Secondary | ICD-10-CM

## 2016-03-22 DIAGNOSIS — K5909 Other constipation: Secondary | ICD-10-CM

## 2016-03-22 LAB — AMYLASE: AMYLASE: 26 U/L — AB (ref 27–131)

## 2016-03-22 LAB — H. PYLORI ANTIBODY, IGG: H Pylori IgG: NEGATIVE

## 2016-03-22 LAB — LIPASE: LIPASE: 3 U/L — AB (ref 11.0–59.0)

## 2016-03-22 MED ORDER — LINACLOTIDE 72 MCG PO CAPS: 72.0000 ug | ORAL_CAPSULE | Freq: Every day | ORAL | Status: DC

## 2016-03-22 NOTE — Progress Notes (Signed)
Subjective:    Patient ID: Karen Byrd, female    DOB: [birth year], 35 y.o.   MRN: [id number]  HPI  Pt presents to the clinic today for ER followup. She went to the ER 03/20/16 with c/o 2-3 weeks of left sided chest pain and pressure, shortness of breath, epigastric pain and worsening reflux. ECG was normal. Troponins were negative. They felt like her pain was related to GERD and not cardiac in nature. She was given a GI cocktail and discharged home. She reports she did not get any relief. Since discharge, She has been having ongoing sharp pains in her chest and sides. The shortness of breath has improved. She did start taking Nexium 2 days ago, but has not noticed a difference. She is very concerned and wants to know what is going on. She has a history of DM 1, but has never been told that she has gastroparesis. She has been constipated, only having 1 BM every 7-10 days. She denies increase stress or anxiety. She has had a VSD repair. She has not seen a cardiologist in a few years.  Review of Systems  Past Medical History  Diagnosis Date  . Diabetes mellitus   . Diabetes mellitus without complication ([place])   . VSD (ventricular septal defect)     Current Outpatient Prescriptions  Medication Sig Dispense Refill  . LANTUS SOLOSTAR 100 UNIT/ML Solostar Pen INJECT 28 UNITS INTO THE SKIN EVERY MORNING. (Patient taking differently: INJECT 31 UNITS INTO THE SKIN EVERY MORNING.) 15 mL 3  . NOVOLOG FLEXPEN 100 UNIT/ML FlexPen Inject 1-10 Units into the skin 3 (three) times daily with meals. 15 mL 1  . PARAGARD INTRAUTERINE COPPER IU 1 Device by Intrauterine route. Every 10 years--inserted 2014     No current facility-administered medications for this visit.    Allergies  Allergen Reactions  . Sulfa Antibiotics Hives    "massive hives"    Family History  Problem Relation Age of Onset  . Anesthesia problems Neg Hx   . Hypotension Neg Hx   . Malignant hyperthermia Neg Hx   . Pseudochol  deficiency Neg Hx   . Hypertension Mother   . Diabetes Mother   . Hyperlipidemia Father   . Hypertension Father   . Heart disease Maternal Grandfather   . Diabetes Maternal Grandfather   . Stroke Maternal Grandfather   . Hyperlipidemia Paternal Grandmother   . Heart disease Paternal Grandmother   . Hypertension Paternal Grandmother   . Diabetes Paternal Grandmother   . Stroke Paternal Grandmother   . Hyperlipidemia Paternal Grandfather   . Hypertension Paternal Grandfather   . Stroke Paternal Grandfather   . Cancer Maternal Grandmother     leukemia    Social History   Social History  . Marital Status: Married    Spouse Name: N/A  . Number of Children: N/A  . Years of Education: N/A   Occupational History  . Not on file.   Social History Main Topics  . Smoking status: Never Smoker   . Smokeless tobacco: Never Used  . Alcohol Use: Yes     Comment: occasional  . Drug Use: No  . Sexual Activity: Yes    Birth Control/ Protection: IUD   Other Topics Concern  . Not on file   Social History Narrative   ** Merged History Encounter **         Constitutional: Denies fever, malaise, fatigue, headache or abrupt weight changes.  HEENT: Denies eye pain, eye redness,  ear pain, ringing in the ears, wax buildup, runny nose, nasal congestion, bloody nose, or sore throat. Respiratory: Pt reports mild shortness of breath. Denies difficulty breathing, cough or sputum production.   Cardiovascular: Pt reports chest tightness. Denies chest pain, palpitations or swelling in the hands or feet.  Gastrointestinal: Pt reports abdominal pain and constipation. Denies abdominal pain, bloating, diarrhea or blood in the stool.  GU: Denies urgency, frequency, pain with urination, burning sensation, blood in urine, odor or discharge.  No other specific complaints in a complete review of systems (except as listed in HPI above).     Objective:   Physical Exam    BP 112/76 mmHg  Pulse 80   Temp(Src) 98 F (36.7 C) (Oral)  Wt 190 lb (86.183 kg)  SpO2 98%  LMP 03/20/2016 Wt Readings from Last 3 Encounters:  03/22/16 190 lb (86.183 kg)  03/20/16 189 lb 1 oz (85.758 kg)  11/03/15 185 lb 9.6 oz (84.188 kg)    General: Appears her stated age, well developed, well nourished in NAD. Cardiovascular: Normal rate and rhythm. S1,S2 noted.  Murmur noted. Pulmonary/Chest: Normal effort and positive vesicular breath sounds. No respiratory distress. No wheezes, rales or ronchi noted.  Abdomen: Soft and tender in the epigastric area. Hypoactive bowel sounds. No distention or masses noted.  Neurological: Alert and oriented.    BMET    Component Value Date/Time   NA 134* 03/20/2016 1100   K 4.1 03/20/2016 1100   CL 103 03/20/2016 1100   CO2 24 03/20/2016 1100   GLUCOSE 360* 03/20/2016 1100   BUN 6 03/20/2016 1100   CREATININE 0.63 03/20/2016 1100   CALCIUM 8.7* 03/20/2016 1100   GFRNONAA >60 03/20/2016 1100   GFRAA >60 03/20/2016 1100    Lipid Panel     Component Value Date/Time   CHOL 194 05/03/2015 0931   TRIG 74.0 05/03/2015 0931   HDL 65.80 05/03/2015 0931   CHOLHDL 3 05/03/2015 0931   VLDL 14.8 05/03/2015 0931   LDLCALC 113* 05/03/2015 0931    CBC    Component Value Date/Time   WBC 6.4 03/20/2016 1100   RBC 4.35 03/20/2016 1100   HGB 13.2 03/20/2016 1100   HCT 40.3 03/20/2016 1100   PLT 264 03/20/2016 1100   MCV 92.6 03/20/2016 1100   MCH 30.3 03/20/2016 1100   MCHC 32.8 03/20/2016 1100   RDW 12.3 03/20/2016 1100    Hgb A1C Lab Results  Component Value Date   HGBA1C 8.7* 05/03/2015        Assessment & Plan:   ER follow up for left sided chest pain, epigastric pain and constipation:  ER notes, labs and imaging reviewed Reassuring that this is not cardiac in nature Will check Amylase, Lipase, H Pylori, D dimer today If labs normal, consider gastric emptying study Continue Nexium for now Will start Linzess 72 mcg daily for chronic  constipation  Will follow up after labs, RTC as needed

## 2016-03-22 NOTE — Progress Notes (Signed)
Pre visit review using our clinic review tool, if applicable. No additional management support is needed unless otherwise documented below in the visit note. 

## 2016-03-22 NOTE — Patient Instructions (Signed)

## 2016-03-23 LAB — D-DIMER, QUANTITATIVE (NOT AT ARMC): D DIMER QUANT: 0.27 ug{FEU}/mL (ref 0.00–0.48)

## 2016-03-26 ENCOUNTER — Telehealth: Payer: Self-pay | Admitting: Internal Medicine

## 2016-03-26 NOTE — Telephone Encounter (Signed)
Patient returned [name]'s call.  She can be reached on her cell phone.

## 2016-03-26 NOTE — Telephone Encounter (Signed)
Pt returned your call.  

## 2016-03-27 NOTE — Telephone Encounter (Signed)
PA has been submitted through [email or web address] with [employer]--awaiting response... Pt is aware and I will call her when they respond

## 2016-03-27 NOTE — Telephone Encounter (Signed)
Received response via fax that Linzess has been approved through 09/2016 Ref # [id number] is aware

## 2016-03-27 NOTE — Telephone Encounter (Signed)
Patient called to get her lab results.  Patient is headed to work.  Please call her back on her cell phone [phone].

## 2016-03-28 ENCOUNTER — Other Ambulatory Visit: Payer: Self-pay | Admitting: Internal Medicine

## 2016-03-28 DIAGNOSIS — K5909 Other constipation: Secondary | ICD-10-CM

## 2016-03-28 DIAGNOSIS — R1013 Epigastric pain: Secondary | ICD-10-CM

## 2016-04-11 ENCOUNTER — Telehealth: Payer: Self-pay | Admitting: *Deleted

## 2016-04-11 NOTE — Telephone Encounter (Signed)
Left message on voicemail.

## 2016-04-11 NOTE — Telephone Encounter (Signed)
Patient called stating that she was having sharp and severe pain in her ribcage area earlier this morning. Patient stated that she now is feeling a lot of pressure in the area and the pain has gotten better. Patient stated that she has been on Linzess for 2 weeks and the medication has only helped slightly. Patient stated that she has an appointment scheduled with a GI doctor at [facility] Monday. Patient wants to know what she should do in the meantime if she has the severe pain again and what she should do about the pressure?  Patient declines having any nausea, diarrhea or any other symptoms. Pharmacy [facility]

## 2016-04-11 NOTE — Telephone Encounter (Signed)
Under the ribcage where. On the sides. At the top of her abdomen. Near her heart?

## 2016-05-15 ENCOUNTER — Other Ambulatory Visit: Payer: Self-pay | Admitting: Gastroenterology

## 2016-05-15 DIAGNOSIS — R1310 Dysphagia, unspecified: Secondary | ICD-10-CM

## 2016-05-22 ENCOUNTER — Ambulatory Visit
Admission: RE | Admit: 2016-05-22 | Discharge: 2016-05-22 | Disposition: A | Discharge status: Home / Self Care | Payer: 59 | Source: Ambulatory Visit | Attending: Gastroenterology | Admitting: Gastroenterology

## 2016-05-22 ENCOUNTER — Encounter: Payer: Self-pay | Admitting: Radiology

## 2016-05-22 DIAGNOSIS — K449 Diaphragmatic hernia without obstruction or gangrene: Secondary | ICD-10-CM | POA: Insufficient documentation

## 2016-05-22 DIAGNOSIS — R1310 Dysphagia, unspecified: Secondary | ICD-10-CM | POA: Insufficient documentation

## 2016-08-02 ENCOUNTER — Encounter: Payer: Self-pay | Admitting: *Deleted

## 2016-08-06 ENCOUNTER — Encounter: Payer: Self-pay | Admitting: Anesthesiology

## 2016-08-06 ENCOUNTER — Encounter: Admission: RE | Payer: Self-pay | Source: Ambulatory Visit

## 2016-08-06 ENCOUNTER — Ambulatory Visit: Admission: RE | Admit: 2016-08-06 | Payer: 59 | Source: Ambulatory Visit | Admitting: Gastroenterology

## 2016-08-06 HISTORY — DX: Depression, unspecified: F32.A

## 2016-08-06 HISTORY — DX: Major depressive disorder, single episode, unspecified: F32.9

## 2016-08-06 SURGERY — COLONOSCOPY WITH PROPOFOL
Anesthesia: General

## 2016-11-19 DIAGNOSIS — L814 Other melanin hyperpigmentation: Secondary | ICD-10-CM | POA: Diagnosis not present

## 2016-11-19 DIAGNOSIS — D225 Melanocytic nevi of trunk: Secondary | ICD-10-CM | POA: Diagnosis not present

## 2016-11-19 DIAGNOSIS — D1801 Hemangioma of skin and subcutaneous tissue: Secondary | ICD-10-CM | POA: Diagnosis not present

## 2016-11-23 ENCOUNTER — Ambulatory Visit
Admission: EM | Admit: 2016-11-23 | Discharge: 2016-11-23 | Disposition: A | Discharge status: Home / Self Care | Payer: 59 | Attending: Internal Medicine | Admitting: Internal Medicine

## 2016-11-23 ENCOUNTER — Ambulatory Visit: Payer: Self-pay | Admitting: Internal Medicine

## 2016-11-23 DIAGNOSIS — F4322 Adjustment disorder with anxiety: Secondary | ICD-10-CM

## 2016-11-23 MED ORDER — SERTRALINE HCL 25 MG PO TABS: 25.0000 mg | ORAL_TABLET | Freq: Every day | ORAL | 0 refills | Status: DC

## 2016-11-23 MED ORDER — SERTRALINE HCL 25 MG PO TABS: 25.0000 mg | ORAL_TABLET | Freq: Every day | ORAL | 1 refills | Status: DC

## 2016-11-23 NOTE — ED Triage Notes (Addendum)
Pt is here because she is having hard time controlling her emotions. She has a lot on her plate right now. She is consistently crying daily for about 4-5 months. She doesn't currently take any meds for mood. She has dealt with depression before but nothing like this.  She has some shortness of breath, her hands are numb at night when she sleeps and clinches her jaw while sleeping. She is also having issues with insurance and getting medications and she mentions she hasn't had a bowel movement in 24 days.

## 2016-11-24 NOTE — ED Provider Notes (Signed)
[facility]    CSN: [id number] Arrival date & time: 11/23/16  1501     History   Chief Complaint Chief Complaint  Patient presents with  . Anxiety    HPI Karen Byrd is a 36 y.o. female. Presents with high stress level, feels anxious/shaky, for 4-5 months. Always worrying about work, can't turn it off.  Increased crying, crying very easily/can't stop for the last 3 days.  Getting to work.  Getting ready to go on business trip, has to make a big presentation, needs to be able to get it together.  Sleeping ok.  Not thinking of hurting herself/others.  Does not think she would be better off dead.  No history bipolar disorder (personal or family).  Not out of control.    HPI  Past Medical History:  Diagnosis Date  . Depression   . Diabetes mellitus   . Diabetes mellitus without complication ([place])   . VSD (ventricular septal defect)     Patient Active Problem List   Diagnosis Date Noted  . Type 1 diabetes mellitus without complication ([place]) 0000000  . Acne 06/29/2014  . Fibrocystic breast changes 06/29/2014    Past Surgical History:  Procedure Laterality Date  . APPENDECTOMY    . CHOLECYSTECTOMY    . DILATION AND CURETTAGE OF UTERUS  2014  . TONSILLECTOMY    . VSD REPAIR  May 2000  . VSD REPAIR      OB History    Gravida Para Term Preterm AB Living   2 0 0 0 1 0   SAB TAB Ectopic Multiple Live Births   1 0 0 0         Home Medications    Prior to Admission medications   Medication Sig Start Date End Date Taking? Authorizing Provider  Dapsone 5 % topical gel Apply 1 application topically 2 (two) times daily.   Yes Historical Provider, MD  LANTUS SOLOSTAR 100 UNIT/ML Solostar Pen INJECT 28 UNITS INTO THE SKIN EVERY MORNING. Patient taking differently: INJECT 31 UNITS INTO THE SKIN EVERY MORNING. 09/26/15  Yes Jearld Fenton, NP  linaclotide [facility]) 72 MCG capsule Take 1 capsule (72 mcg total) by mouth daily before breakfast. 03/22/16  Yes Jearld Fenton, NP  NOVOLOG FLEXPEN 100 UNIT/ML FlexPen Inject 1-10 Units into the skin 3 (three) times daily with meals. 07/22/15  Yes Jearld Fenton, NP  PARAGARD INTRAUTERINE COPPER IU 1 Device by Intrauterine route. Every 10 years--inserted 2014   Yes Historical Provider, MD  sertraline (ZOLOFT) 25 MG tablet Take 1 tablet (25 mg total) by mouth daily. 11/23/16   Sherlene Shams, MD    Family History Family History  Problem Relation Age of Onset  . Hypertension Mother   . Diabetes Mother   . Hyperlipidemia Father   . Hypertension Father   . Heart disease Maternal Grandfather   . Diabetes Maternal Grandfather   . Stroke Maternal Grandfather   . Hyperlipidemia Paternal Grandmother   . Heart disease Paternal Grandmother   . Hypertension Paternal Grandmother   . Diabetes Paternal Grandmother   . Stroke Paternal Grandmother   . Hyperlipidemia Paternal Grandfather   . Hypertension Paternal Grandfather   . Stroke Paternal Grandfather   . Cancer Maternal Grandmother     leukemia  . Anesthesia problems Neg Hx   . Hypotension Neg Hx   . Malignant hyperthermia Neg Hx   . Pseudochol deficiency Neg Hx     Social History Social  History  Substance Use Topics  . Smoking status: Never Smoker  . Smokeless tobacco: Never Used  . Alcohol use Yes     Comment: occasional     Allergies   Sulfa antibiotics   Review of Systems Review of Systems  All other systems reviewed and are negative.    Physical Exam Triage Vital Signs ED Triage Vitals  Enc Vitals Group     BP 11/23/16 1521 134/65     Pulse Rate 11/23/16 1521 89     Resp 11/23/16 1521 18     Temp 11/23/16 1521 98.4 F (36.9 C)     Temp Source 11/23/16 1521 Oral     SpO2 11/23/16 1521 100 %     Weight 11/23/16 1521 190 lb (86.2 kg)     Height 11/23/16 1521 5\' 11"  (1.803 m)     Pain Score 11/23/16 1525 0     Pain Loc --    Updated Vital Signs BP 134/65 (BP Location: Left Arm)   Pulse 89   Temp 98.4 F (36.9 C) (Oral)    Resp 18   Ht 5\' 11"  (1.803 m)   Wt 190 lb (86.2 kg)   SpO2 100%   BMI 26.50 kg/m   Physical Exam  Constitutional: She is oriented to person, place, and time. No distress.  Alert, nicely groomed Slightly tearful at times.  Speech is a little fast but not pressured/loud.  HENT:  Head: Atraumatic.  Eyes:  Conjugate gaze, no eye redness/drainage  Neck: Neck supple.  Cardiovascular: Normal rate.   Pulmonary/Chest: No respiratory distress.  Abdominal: She exhibits no distension.  Musculoskeletal: Normal range of motion.  No leg swelling  Neurological: She is alert and oriented to person, place, and time.  Speech is clear/coherent No obvious tremor  Skin: Skin is warm and dry.  No cyanosis  Nursing note and vitals reviewed.    UC Treatments / Results   Procedures Procedures (including critical care time) None today   Final Clinical Impressions(s) / UC Diagnoses   Final diagnoses:  Adjustment disorder with anxious mood   followup with PCP in 1-2 weeks to reassess.  To ER if any thoughts of self harm/harming others.  Meds ordered this encounter  . sertraline (ZOLOFT) 25 MG tablet    Sig: Take 1 tablet (25 mg total) by mouth daily.    Dispense:  15 tablet    Refill:  1      Sherlene Shams, MD 11/24/16 2157

## 2016-12-04 ENCOUNTER — Ambulatory Visit (INDEPENDENT_AMBULATORY_CARE_PROVIDER_SITE_OTHER): Payer: 59 | Admitting: Internal Medicine

## 2016-12-04 ENCOUNTER — Encounter: Payer: Self-pay | Admitting: Internal Medicine

## 2016-12-04 VITALS — BP 126/74 | HR 76 | Temp 97.9°F | Wt 184.0 lb

## 2016-12-04 DIAGNOSIS — F418 Other specified anxiety disorders: Secondary | ICD-10-CM | POA: Diagnosis not present

## 2016-12-04 DIAGNOSIS — F329 Major depressive disorder, single episode, unspecified: Secondary | ICD-10-CM

## 2016-12-04 DIAGNOSIS — F32A Depression, unspecified: Secondary | ICD-10-CM

## 2016-12-04 DIAGNOSIS — K5901 Slow transit constipation: Secondary | ICD-10-CM | POA: Diagnosis not present

## 2016-12-04 DIAGNOSIS — F419 Anxiety disorder, unspecified: Principal | ICD-10-CM

## 2016-12-04 NOTE — Progress Notes (Signed)
Subjective:    Patient ID: Karen Byrd, female    DOB: 18-May-1981, 35 y.o.   MRN: [id number]  HPI  Pt presents to the clinic today for ER follow up. She went to the ER 11/23/16 after she missed her appt here at the office. Her main complaint was difficulty controlling her emotions, crying all the times. She noticed these symptoms about 4-5 months ago. She reports these feelings are triggered by work stress, working long hours. She also has marital stress, lack of support from her husband. She is also have insurance issues, not being able to get her medications filled, which is adding to her stress. She was started on Zoloft 25 mg daily. She also c/o constipation at this ER visit. They refilled her Linzess 72 mcg daily. Since her ER visit, she reports the Zoloft is making her feel jittery and nausea. She has feel like her symptoms have gotten a little better. Overall, she feels slightly improved but not where she wants to be. Her constipation has improved, she goes 1-2 times a week with Linzess which is normal for her.  Review of Systems      Past Medical History:  Diagnosis Date  . Depression   . Diabetes mellitus   . Diabetes mellitus without complication ([place])   . VSD (ventricular septal defect)     Current Outpatient Prescriptions  Medication Sig Dispense Refill  . Dapsone 5 % topical gel Apply 1 application topically 2 (two) times daily.    Marland Kitchen LANTUS SOLOSTAR 100 UNIT/ML Solostar Pen INJECT 28 UNITS INTO THE SKIN EVERY MORNING. (Patient taking differently: INJECT 31 UNITS INTO THE SKIN EVERY MORNING.) 15 mL 3  . linaclotide (LINZESS) 72 MCG capsule Take 1 capsule (72 mcg total) by mouth daily before breakfast. 30 capsule 2  . NOVOLOG FLEXPEN 100 UNIT/ML FlexPen Inject 1-10 Units into the skin 3 (three) times daily with meals. 15 mL 1  . PARAGARD INTRAUTERINE COPPER IU 1 Device by Intrauterine route. Every 10 years--inserted 2014    . sertraline (ZOLOFT) 25 MG tablet Take 1 tablet  (25 mg total) by mouth daily. 15 tablet 1   No current facility-administered medications for this visit.     Allergies  Allergen Reactions  . Sulfa Antibiotics Hives    "massive hives"    Family History  Problem Relation Age of Onset  . Hypertension Mother   . Diabetes Mother   . Hyperlipidemia Father   . Hypertension Father   . Heart disease Maternal Grandfather   . Diabetes Maternal Grandfather   . Stroke Maternal Grandfather   . Hyperlipidemia Paternal Grandmother   . Heart disease Paternal Grandmother   . Hypertension Paternal Grandmother   . Diabetes Paternal Grandmother   . Stroke Paternal Grandmother   . Hyperlipidemia Paternal Grandfather   . Hypertension Paternal Grandfather   . Stroke Paternal Grandfather   . Cancer Maternal Grandmother     leukemia  . Anesthesia problems Neg Hx   . Hypotension Neg Hx   . Malignant hyperthermia Neg Hx   . Pseudochol deficiency Neg Hx     Social History   Social History  . Marital status: Married    Spouse name: N/A  . Number of children: N/A  . Years of education: N/A   Occupational History  . Not on file.   Social History Main Topics  . Smoking status: Never Smoker  . Smokeless tobacco: Never Used  . Alcohol use Yes     Comment:  occasional  . Drug use: No  . Sexual activity: Yes    Birth control/ protection: IUD   Other Topics Concern  . Not on file   Social History Narrative   ** Merged History Encounter **         Constitutional: Pt reports fatigue. Denies fever, malaise, fatigue, or abrupt weight changes.  Gastrointestinal: Pt reports constipation. Denies abdominal pain, bloating, diarrhea or blood in the stool.  GU: Denies urgency, frequency, pain with urination, burning sensation, blood in urine, odor or discharge. Neurological: Pt reports jitteriness. Denies dizziness, difficulty with memory, difficulty with speech or problems with balance and coordination.  Psych: Pt reports anxiety and  depression. Denies SI/HI.  No other specific complaints in a complete review of systems (except as listed in HPI above).  Objective:   Physical Exam  BP 126/74   Pulse 76   Temp 97.9 F (36.6 C) (Oral)   Wt 184 lb (83.5 kg)   SpO2 98%   BMI 25.66 kg/m  Wt Readings from Last 3 Encounters:  12/04/16 184 lb (83.5 kg)  11/23/16 190 lb (86.2 kg)  03/22/16 190 lb (86.2 kg)    General: Appears her stated age,  in NAD. Abdomen: Soft and nontender. Hypoactive bowel sounds. No distention or masses noted. Neurological: Alert and oriented. Psychiatric: She is on the verge of tears today. Judgement and thought content seem normal.  BMET    Component Value Date/Time   NA 134 (L) 03/20/2016 1100   K 4.1 03/20/2016 1100   CL 103 03/20/2016 1100   CO2 24 03/20/2016 1100   GLUCOSE 360 (H) 03/20/2016 1100   BUN 6 03/20/2016 1100   CREATININE 0.63 03/20/2016 1100   CALCIUM 8.7 (L) 03/20/2016 1100   GFRNONAA >60 03/20/2016 1100   GFRAA >60 03/20/2016 1100    Lipid Panel     Component Value Date/Time   CHOL 194 05/03/2015 0931   TRIG 74.0 05/03/2015 0931   HDL 65.80 05/03/2015 0931   CHOLHDL 3 05/03/2015 0931   VLDL 14.8 05/03/2015 0931   LDLCALC 113 (H) 05/03/2015 0931    CBC    Component Value Date/Time   WBC 6.4 03/20/2016 1100   RBC 4.35 03/20/2016 1100   HGB 13.2 03/20/2016 1100   HCT 40.3 03/20/2016 1100   PLT 264 03/20/2016 1100   MCV 92.6 03/20/2016 1100   MCH 30.3 03/20/2016 1100   MCHC 32.8 03/20/2016 1100   RDW 12.3 03/20/2016 1100    Hgb A1C Lab Results  Component Value Date   HGBA1C 8.7 (H) 05/03/2015            Assessment & Plan:   ER follow up for Anxiety, Depression and Constipation:  ER notes reviewed Since she is having side effects with Zoloft, will not increase at this time We will continue current dose of Zoloft and Linzess for now  Update me in 2 weeks via mychart and let me know how you are doing Webb Silversmith, NP

## 2016-12-04 NOTE — Patient Instructions (Signed)
Generalized Anxiety Disorder Generalized anxiety disorder (GAD) is a mental disorder. It interferes with life functions, including relationships, work, and school. GAD is different from normal anxiety, which everyone experiences at some point in their lives in response to specific life events and activities. Normal anxiety actually helps us prepare for and get through these life events and activities. Normal anxiety goes away after the event or activity is over.  GAD causes anxiety that is not necessarily related to specific events or activities. It also causes excess anxiety in proportion to specific events or activities. The anxiety associated with GAD is also difficult to control. GAD can vary from mild to severe. People with severe GAD can have intense waves of anxiety with physical symptoms (panic attacks).  SYMPTOMS The anxiety and worry associated with GAD are difficult to control. This anxiety and worry are related to many life events and activities and also occur more days than not for 6 months or longer. People with GAD also have three or more of the following symptoms (one or more in children):  Restlessness.   Fatigue.  Difficulty concentrating.   Irritability.  Muscle tension.  Difficulty sleeping or unsatisfying sleep. DIAGNOSIS GAD is diagnosed through an assessment by your health care provider. Your health care provider will ask you questions aboutyour mood,physical symptoms, and events in your life. Your health care provider may ask you about your medical history and use of alcohol or drugs, including prescription medicines. Your health care provider may also do a physical exam and blood tests. Certain medical conditions and the use of certain substances can cause symptoms similar to those associated with GAD. Your health care provider may refer you to a mental health specialist for further evaluation. TREATMENT The following therapies are usually used to treat GAD:    Medication. Antidepressant medication usually is prescribed for long-term daily control. Antianxiety medicines may be added in severe cases, especially when panic attacks occur.   Talk therapy (psychotherapy). Certain types of talk therapy can be helpful in treating GAD by providing support, education, and guidance. A form of talk therapy called cognitive behavioral therapy can teach you healthy ways to think about and react to daily life events and activities.  Stress managementtechniques. These include yoga, meditation, and exercise and can be very helpful when they are practiced regularly. A mental health specialist can help determine which treatment is best for you. Some people see improvement with one therapy. However, other people require a combination of therapies. This information is not intended to replace advice given to you by your health care provider. Make sure you discuss any questions you have with your health care provider. Document Released: 02/02/2013 Document Revised: 10/29/2014 Document Reviewed: 02/02/2013 Elsevier Interactive Patient Education  2017 [employer] Inc[employer]  

## 2016-12-12 ENCOUNTER — Ambulatory Visit: Payer: 59 | Admitting: Psychology

## 2017-01-22 ENCOUNTER — Encounter: Payer: Self-pay | Admitting: Internal Medicine

## 2017-01-22 ENCOUNTER — Ambulatory Visit (INDEPENDENT_AMBULATORY_CARE_PROVIDER_SITE_OTHER): Payer: 59 | Admitting: Internal Medicine

## 2017-01-22 VITALS — BP 104/60 | HR 88 | Temp 98.1°F | Resp 12 | Wt 187.0 lb

## 2017-01-22 DIAGNOSIS — J0110 Acute frontal sinusitis, unspecified: Secondary | ICD-10-CM | POA: Diagnosis not present

## 2017-01-22 NOTE — Progress Notes (Signed)
Pre visit review using our clinic review tool, if applicable. No additional management support is needed unless otherwise documented below in the visit note. 

## 2017-01-22 NOTE — Progress Notes (Signed)
Subjective:    Patih ent ID: Karen Byrd, female    DOB: Jul 07, 1981, 36 y.o.   MRN: [id number]  HPI Here due to respiratory infection  Having trouble with ears bothering her Frontal headache like past sinus infections Fatigue Bad cough--mostly at night Some drainage--PND-- and slight clear rhinorrhea  Started 2 days ago and really bad yesterday Cough is dry No SOB Some fever--- close to 101 yesterday Some sweats yesterday  Has used advil-- 2 tabs---- not much help  Current Outpatient Prescriptions on File Prior to Visit  Medication Sig Dispense Refill  . Dapsone 5 % topical gel Apply 1 application topically 2 (two) times daily.    . Insulin Glargine (BASAGLAR KWIKPEN) 100 UNIT/ML SOPN Inject 24 Units into the skin every morning.    . linaclotide (LINZESS) 145 MCG CAPS capsule Take by mouth.    Marland Kitchen NOVOLOG FLEXPEN 100 UNIT/ML FlexPen Inject 1-10 Units into the skin 3 (three) times daily with meals. 15 mL 1  . PARAGARD INTRAUTERINE COPPER IU 1 Device by Intrauterine route. Every 10 years--inserted 2014    . sertraline (ZOLOFT) 25 MG tablet Take 1 tablet (25 mg total) by mouth daily. 15 tablet 1   No current facility-administered medications on file prior to visit.     Allergies  Allergen Reactions  . Sulfa Antibiotics Hives    "massive hives"    Past Medical History:  Diagnosis Date  . Depression   . Diabetes mellitus   . Diabetes mellitus without complication ([place])   . VSD (ventricular septal defect)     Past Surgical History:  Procedure Laterality Date  . APPENDECTOMY    . CHOLECYSTECTOMY    . DILATION AND CURETTAGE OF UTERUS  2014  . TONSILLECTOMY    . VSD REPAIR  May 2000  . VSD REPAIR      Family History  Problem Relation Age of Onset  . Hypertension Mother   . Diabetes Mother   . Hyperlipidemia Father   . Hypertension Father   . Heart disease Maternal Grandfather   . Diabetes Maternal Grandfather   . Stroke Maternal Grandfather   .  Hyperlipidemia Paternal Grandmother   . Heart disease Paternal Grandmother   . Hypertension Paternal Grandmother   . Diabetes Paternal Grandmother   . Stroke Paternal Grandmother   . Hyperlipidemia Paternal Grandfather   . Hypertension Paternal Grandfather   . Stroke Paternal Grandfather   . Cancer Maternal Grandmother     leukemia  . Anesthesia problems Neg Hx   . Hypotension Neg Hx   . Malignant hyperthermia Neg Hx   . Pseudochol deficiency Neg Hx     Social History   Social History  . Marital status: Married    Spouse name: N/A  . Number of children: N/A  . Years of education: N/A   Occupational History  . Not on file.   Social History Main Topics  . Smoking status: Never Smoker  . Smokeless tobacco: Never Used  . Alcohol use Yes     Comment: occasional  . Drug use: No  . Sexual activity: Yes    Birth control/ protection: IUD   Other Topics Concern  . Not on file   Social History Narrative   ** Merged History Encounter **       Review of Systems  No nausea Chronic constipation--not much different Appetite is off No rash     Objective:   Physical Exam  Constitutional: She appears well-developed and well-nourished. No  distress.  HENT:  Mild frontal tenderness Moderate nasal inflammation TMs normal Slight pharyngeal injection without exudate  Neck: No thyromegaly present.  Pulmonary/Chest: Effort normal and breath sounds normal. No respiratory distress. She has no wheezes. She has no rales.  Lymphadenopathy:    She has no cervical adenopathy.  Skin: No rash noted.          Assessment & Plan:

## 2017-01-22 NOTE — Assessment & Plan Note (Signed)
Clearly seems to be viral Discussed supportive Rx If worsening into next week, would try empiric amoxil

## 2017-01-24 MED ORDER — SERTRALINE HCL 50 MG PO TABS
50.0000 mg | ORAL_TABLET | Freq: Every day | ORAL | 11 refills | Status: DC
Start: 2017-01-24 — End: 2017-03-08

## 2017-03-05 ENCOUNTER — Ambulatory Visit
Admission: EM | Admit: 2017-03-05 | Discharge: 2017-03-05 | Disposition: A | Discharge status: Home / Self Care | Payer: 59 | Attending: Family Medicine | Admitting: Family Medicine

## 2017-03-05 DIAGNOSIS — H6691 Otitis media, unspecified, right ear: Secondary | ICD-10-CM

## 2017-03-05 DIAGNOSIS — J029 Acute pharyngitis, unspecified: Secondary | ICD-10-CM | POA: Diagnosis not present

## 2017-03-05 LAB — RAPID STREP SCREEN (MED CTR MEBANE ONLY): STREPTOCOCCUS, GROUP A SCREEN (DIRECT): NEGATIVE

## 2017-03-05 MED ORDER — AMOXICILLIN 875 MG PO TABS: 875.0000 mg | ORAL_TABLET | Freq: Two times a day (BID) | ORAL | 0 refills | Status: DC

## 2017-03-05 NOTE — Discharge Instructions (Signed)
Take medication as prescribed. Rest. Drink plenty of fluids. Take home antihistamine as discussed.   Follow up with your primary care physician this week as needed. Return to Urgent care for new or worsening concerns.

## 2017-03-05 NOTE — ED Triage Notes (Signed)
Pt says she hasnt felt good the last 2 days but this afternoon her throat and ears started really bothering her.

## 2017-03-05 NOTE — ED Provider Notes (Signed)
[facility] ____________________________________________  Time seen: Approximately 6: 05 PM  I have reviewed the triage vital signs and the nursing notes.   HISTORY  Chief Complaint Sore Throat and Otalgia  HPI Karen Byrd is a 36 y.o. female presents for complaints of 1-2 days of runny nose, nasal congestion, sore throat and right ear discomfort. Patient states today sore throat increased and has pain with swallowing. Reports does continue to drink well. States mild sore throat at this time. States mild to moderate right ear pain at this time. Denies pain radiation. Denies neck pain. Denies trauma, hearing changes or ear drainage. Denies fevers. Reports has continued to eat and drink well. 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 Reports has been working a lot lately. Denies cough or chest congestion. Reports no medications over-the-counter taken prior to arrival. Denies seasonal allergies.  Denies chest pain, shortness of breath, abdominal pain, dysuria, extremity pain, extremity swelling or rash. Denies recent sickness. Denies recent antibiotic use.   No LMP recorded. Patient is not currently having periods (Reason: IUD).Denies pregnancy.   Past Medical History:  Diagnosis Date  . Depression   . Diabetes mellitus   . Diabetes mellitus without complication ([place])   . VSD (ventricular septal defect)     Patient Active Problem List   Diagnosis Date Noted  . Acute non-recurrent frontal sinusitis 01/22/2017  . Type 1 diabetes mellitus without complication ([place]) 65/12/5463    Past Surgical History:  Procedure Laterality Date  . APPENDECTOMY    . CHOLECYSTECTOMY    . DILATION AND CURETTAGE OF UTERUS  2014  . TONSILLECTOMY    . VSD REPAIR  May 2000  . VSD REPAIR       No current facility-administered medications for this encounter.   Current Outpatient Prescriptions:  .  Insulin Glargine  (BASAGLAR KWIKPEN) 100 UNIT/ML SOPN, Inject 24 Units into the skin every morning., Disp: , Rfl:  .  linaclotide (LINZESS) 145 MCG CAPS capsule, Take by mouth., Disp: , Rfl:  .  NOVOLOG FLEXPEN 100 UNIT/ML FlexPen, Inject 1-10 Units into the skin 3 (three) times daily with meals., Disp: 15 mL, Rfl: 1 .  sertraline (ZOLOFT) 50 MG tablet, Take 1 tablet (50 mg total) by mouth daily., Disp: 30 tablet, Rfl: 11 .  amoxicillin (AMOXIL) 875 MG tablet, Take 1 tablet (875 mg total) by mouth 2 (two) times daily., Disp: 20 tablet, Rfl: 0 .  PARAGARD INTRAUTERINE COPPER IU, 1 Device by Intrauterine route. Every 10 years--inserted 2014, Disp: , Rfl:   Allergies Sulfa antibiotics  Family History  Problem Relation Age of Onset  . Hypertension Mother   . Diabetes Mother   . Hyperlipidemia Father   . Hypertension Father   . Heart disease Maternal Grandfather   . Diabetes Maternal Grandfather   . Stroke Maternal Grandfather   . Hyperlipidemia Paternal Grandmother   . Heart disease Paternal Grandmother   . Hypertension Paternal Grandmother   . Diabetes Paternal Grandmother   . Stroke Paternal Grandmother   . Hyperlipidemia Paternal Grandfather   . Hypertension Paternal Grandfather   . Stroke Paternal Grandfather   . Cancer Maternal Grandmother        leukemia  . Anesthesia problems Neg Hx   . Hypotension Neg Hx   . Malignant hyperthermia Neg Hx   . Pseudochol deficiency Neg Hx     Social History Social History  Substance Use Topics  . Smoking  status: Never Smoker  . Smokeless tobacco: Never Used  . Alcohol use Yes     Comment: occasional    Review of Systems Constitutional: No fever/chills ENT: Positive sore throat. Cardiovascular: Denies chest pain. Respiratory: Denies shortness of breath. Gastrointestinal: No abdominal pain.  No nausea, no vomiting.  No diarrhea.   Genitourinary: Negative for dysuria. Musculoskeletal: Negative for back pain. Skin: Negative for  rash.  ____________________________________________   PHYSICAL EXAM:  VITAL SIGNS: ED Triage Vitals [03/05/17 1730]  Enc Vitals Group     BP (!) 116/59     Pulse Rate 74     Resp 18     Temp 98.4 F (36.9 C)     Temp Source Oral     SpO2 99 %     Weight 180 lb (81.6 kg)     Height 5\' 11"  (1.803 m)     Head Circumference      Peak Flow      Pain Score 7     Pain Loc      Pain Edu?      Excl. in [place]?     Constitutional: Alert and oriented. Well appearing and in no acute distress. Eyes: Conjunctivae are normal. PERRL. EOMI. Head: Atraumatic. No sinus tenderness to palpation. No swelling. No erythema.  Ears: Right: mild tenderness to auricle movement, moderate erythema, mild bulging TM, TM otherwise appears normal. Left: nontender, no erythema, normal TM. No surrounding tenderness, swelling or erythema bilaterally.   Nose:Nasal congestion  Mouth/Throat: Mucous membranes are moist. Mild pharyngeal erythema. Tonsils surgically absent. No exudate.   Neck: No stridor.  No cervical spine tenderness to palpation. Hematological/Lymphatic/Immunilogical: No cervical lymphadenopathy. Cardiovascular: Normal rate, regular rhythm. Grossly normal heart sounds.  Good peripheral circulation. Respiratory: Normal respiratory effort.  No retractions. No wheezes, rales or rhonchi. Good air movement.  Gastrointestinal: Soft and nontender.  Musculoskeletal: Ambulatory with steady gait. No cervical, thoracic or lumbar tenderness to palpation. Neurologic:  Normal speech and language. No gait instability. Skin:  Skin appears warm, dry. Psychiatric: Mood and affect are normal. Speech and behavior are normal.  ___________________________________________   LABS (all labs ordered are listed, but only abnormal results are displayed)  Labs Reviewed  RAPID STREP SCREEN (NOT AT [facility])  CULTURE, GROUP A STREP [facility])    RADIOLOGY  No results  found. ____________________________________________   PROCEDURES Procedures    INITIAL IMPRESSION / ASSESSMENT AND PLAN / ED COURSE  Pertinent labs & imaging results that were available during my care of the patient were reviewed by me and considered in my medical decision making (see chart for details).  Well-appearing patient. No acute distress. A right otitis media. Quick strep negative, will culture. Discussed with patient evaluation of mono, patient declines at this time and states will monitor. Suspect viral pharyngitis. Will treat right otitis with oral amoxicillin. Encouraged rest, fluids and supportive care. Patient states she'll take over-the-counter Zyrtec.Discussed indication, risks and benefits of medications with patient.  Discussed follow up with Primary care physician this week. Discussed follow up and return parameters including no resolution or any worsening concerns. Patient verbalized understanding and agreed to plan.   ____________________________________________   FINAL CLINICAL IMPRESSION(S) / ED DIAGNOSES  Final diagnoses:  Right otitis media, unspecified otitis media type  Pharyngitis, unspecified etiology     Discharge Medication List as of 03/05/2017  6:13 PM    START taking these medications   Details  amoxicillin (AMOXIL) 875 MG tablet Take 1 tablet (875 mg total) by  mouth 2 (two) times daily., Starting Tue 03/05/2017, Normal        Note: This dictation was prepared with Dragon dictation along with smaller phrase technology. Any transcriptional errors that result from this process are unintentional.         Marylene Land, NP 03/05/17 2043

## 2017-03-08 ENCOUNTER — Other Ambulatory Visit: Payer: Self-pay

## 2017-03-08 LAB — CULTURE, GROUP A STREP (THRC)

## 2017-03-08 MED ORDER — SERTRALINE HCL 50 MG PO TABS: 50.0000 mg | ORAL_TABLET | Freq: Every day | ORAL | 0 refills | Status: DC

## 2017-03-13 ENCOUNTER — Ambulatory Visit (INDEPENDENT_AMBULATORY_CARE_PROVIDER_SITE_OTHER): Payer: 59 | Admitting: Family

## 2017-03-13 ENCOUNTER — Encounter: Payer: Self-pay | Admitting: Family

## 2017-03-13 VITALS — BP 104/62 | HR 76 | Temp 98.6°F | Resp 14 | Wt 184.0 lb

## 2017-03-13 DIAGNOSIS — J40 Bronchitis, not specified as acute or chronic: Secondary | ICD-10-CM

## 2017-03-13 MED ORDER — HYDROCODONE-HOMATROPINE 5-1.5 MG/5ML PO SYRP: 5.0000 mL | ORAL_SOLUTION | Freq: Every evening | ORAL | 0 refills | Status: DC | PRN

## 2017-03-13 MED ORDER — ALBUTEROL SULFATE HFA 108 (90 BASE) MCG/ACT IN AERS: 2.0000 | INHALATION_SPRAY | Freq: Four times a day (QID) | RESPIRATORY_TRACT | 1 refills | Status: DC | PRN

## 2017-03-13 MED ORDER — BENZONATATE 100 MG PO CAPS: 100.0000 mg | ORAL_CAPSULE | Freq: Two times a day (BID) | ORAL | 0 refills | Status: DC | PRN

## 2017-03-13 NOTE — Progress Notes (Signed)
Subjective:    Patient ID: Karen Byrd, female    DOB: 1981-06-23, 36 y.o.   MRN: [id number]  CC: Karen Byrd is a 36 y.o. female who presents today for an acute visit.    HPI: CC: Sore throat, non productive cough for 6 days, worsening.   No fever, wheezing. Some SOB with climbing stairs. Endorses sinus pressure, nasal congestion.  Has been taking alseltzer with no relief.  Has been on amoxicillin for 8 days, no improvement. Started at urgent care for sore throat, right ear pain. Ear pain is better. Strep negative.  No lung disease.         HISTORY:  Past Medical History:  Diagnosis Date  . Depression   . Diabetes mellitus   . Diabetes mellitus without complication ([place])   . VSD (ventricular septal defect)    Past Surgical History:  Procedure Laterality Date  . APPENDECTOMY    . CHOLECYSTECTOMY    . DILATION AND CURETTAGE OF UTERUS  2014  . TONSILLECTOMY    . VSD REPAIR  May 2000  . VSD REPAIR     Family History  Problem Relation Age of Onset  . Hypertension Mother   . Diabetes Mother   . Hyperlipidemia Father   . Hypertension Father   . Heart disease Maternal Grandfather   . Diabetes Maternal Grandfather   . Stroke Maternal Grandfather   . Hyperlipidemia Paternal Grandmother   . Heart disease Paternal Grandmother   . Hypertension Paternal Grandmother   . Diabetes Paternal Grandmother   . Stroke Paternal Grandmother   . Hyperlipidemia Paternal Grandfather   . Hypertension Paternal Grandfather   . Stroke Paternal Grandfather   . Cancer Maternal Grandmother        leukemia  . Anesthesia problems Neg Hx   . Hypotension Neg Hx   . Malignant hyperthermia Neg Hx   . Pseudochol deficiency Neg Hx     Allergies: Sulfa antibiotics Current Outpatient Prescriptions on File Prior to Visit  Medication Sig Dispense Refill  . amoxicillin (AMOXIL) 875 MG tablet Take 1 tablet (875 mg total) by mouth 2 (two) times daily. 20 tablet 0  . Insulin Glargine  (BASAGLAR KWIKPEN) 100 UNIT/ML SOPN Inject 24 Units into the skin every morning.    . linaclotide (LINZESS) 145 MCG CAPS capsule Take by mouth.    Marland Kitchen NOVOLOG FLEXPEN 100 UNIT/ML FlexPen Inject 1-10 Units into the skin 3 (three) times daily with meals. 15 mL 1  . PARAGARD INTRAUTERINE COPPER IU 1 Device by Intrauterine route. Every 10 years--inserted 2014    . sertraline (ZOLOFT) 50 MG tablet Take 1 tablet (50 mg total) by mouth daily. SCHEDULE ANNUAL PHYSICAL EXAM 90 tablet 0   No current facility-administered medications on file prior to visit.     Social History  Substance Use Topics  . Smoking status: Never Smoker  . Smokeless tobacco: Never Used  . Alcohol use Yes     Comment: occasional    Review of Systems  Constitutional: Negative for chills and fever.  Respiratory: Positive for cough.   Cardiovascular: Negative for chest pain and palpitations.  Gastrointestinal: Negative for nausea and vomiting.      Objective:    BP 104/62 (BP Location: Right Arm, Patient Position: Sitting, Cuff Size: Normal)   Pulse 76   Temp 98.6 F (37 C) (Oral)   Resp 14   Wt 184 lb (83.5 kg)   LMP 03/07/2017   SpO2 98%   BMI 25.66  kg/m    Physical Exam  Constitutional: She appears well-developed and well-nourished.  HENT:  Head: Normocephalic and atraumatic.  Right Ear: Hearing, tympanic membrane, external ear and ear canal normal. No drainage, swelling or tenderness. No foreign bodies. Tympanic membrane is not erythematous and not bulging. No middle ear effusion. No decreased hearing is noted.  Left Ear: Hearing, tympanic membrane, external ear and ear canal normal. No drainage, swelling or tenderness. No foreign bodies. Tympanic membrane is not erythematous and not bulging.  No middle ear effusion. No decreased hearing is noted.  Nose: Nose normal. No rhinorrhea. Right sinus exhibits no maxillary sinus tenderness and no frontal sinus tenderness. Left sinus exhibits no maxillary sinus  tenderness and no frontal sinus tenderness.  Mouth/Throat: Uvula is midline, oropharynx is clear and moist and mucous membranes are normal. No oropharyngeal exudate, posterior oropharyngeal edema, posterior oropharyngeal erythema or tonsillar abscesses.  Eyes: Conjunctivae are normal.  Cardiovascular: Regular rhythm, normal heart sounds and normal pulses.   Pulmonary/Chest: Effort normal and breath sounds normal. She has no wheezes. She has no rhonchi. She has no rales.  Lymphadenopathy:       Head (right side): No submental, no submandibular, no tonsillar, no preauricular, no posterior auricular and no occipital adenopathy present.       Head (left side): No submental, no submandibular, no tonsillar, no preauricular, no posterior auricular and no occipital adenopathy present.    She has no cervical adenopathy.  Neurological: She is alert.  Skin: Skin is warm and dry.  Psychiatric: She has a normal mood and affect. Her speech is normal and behavior is normal. Thought content normal.  Vitals reviewed.      Assessment & Plan:   1. Bronchitis sao2 98. Afebrile and patient is well-appearing. Patient and I agreed on conservative therapy as I discussed with her that bronchitis is most often of viral etiology. Suspect viral pharyngitis as well ( strep negative per patient).  She agreed with treating with cough medication, inhaler as needed.   Return precautions given.   - HYDROcodone-homatropine (HYCODAN) 5-1.5 MG/5ML syrup; Take 5 mLs by mouth at bedtime as needed for cough.  Dispense: 30 mL; Refill: 0 - benzonatate (TESSALON) 100 MG capsule; Take 1 capsule (100 mg total) by mouth 2 (two) times daily as needed for cough.  Dispense: 20 capsule; Refill: 0 - albuterol (PROVENTIL HFA) 108 (90 Base) MCG/ACT inhaler; Inhale 2 puffs into the lungs every 6 (six) hours as needed for wheezing or shortness of breath.  Dispense: 1 Inhaler; Refill: 1    I am having [name] maintain her PARAGARD  INTRAUTERINE COPPER IU, NOVOLOG FLEXPEN, BASAGLAR KWIKPEN, linaclotide, amoxicillin, and sertraline.   No orders of the defined types were placed in this encounter.   Return precautions given.   Risks, benefits, and alternatives of the medications and treatment plan prescribed today were discussed, and patient expressed understanding.   Education regarding symptom management and diagnosis given to patient on AVS.  Continue to follow with Jearld Fenton, NP for routine health maintenance.   Karen Byrd and I agreed with plan.   Mable Paris, FNP

## 2017-03-13 NOTE — Progress Notes (Signed)
Pre-visit discussion using our clinic review tool. No additional management support is needed unless otherwise documented below in the visit note.  

## 2017-03-13 NOTE — Patient Instructions (Addendum)
  As discussed, suspect viral in nature.  Tessalon Perles during the day as needed Hycodan cough syrup at bedtime as needed  Use albuterol every 6 hours for first 24 hours to get good medication into the lungs and loosen congestion; after, you may use as needed and eventually stop all together when cough resolves.  Let me know if not better, or if new, worsening symptoms and will start another antibiotic.

## 2017-03-19 ENCOUNTER — Encounter: Payer: Self-pay | Admitting: Family Medicine

## 2017-03-19 ENCOUNTER — Ambulatory Visit: Payer: Self-pay | Admitting: Internal Medicine

## 2017-03-19 ENCOUNTER — Ambulatory Visit (INDEPENDENT_AMBULATORY_CARE_PROVIDER_SITE_OTHER): Payer: 59 | Admitting: Family Medicine

## 2017-03-19 VITALS — BP 112/74 | HR 81 | Temp 98.1°F | Resp 20 | Wt 187.0 lb

## 2017-03-19 DIAGNOSIS — J40 Bronchitis, not specified as acute or chronic: Secondary | ICD-10-CM

## 2017-03-19 DIAGNOSIS — Z0289 Encounter for other administrative examinations: Secondary | ICD-10-CM

## 2017-03-19 MED ORDER — AZITHROMYCIN 250 MG PO TABS: ORAL_TABLET | ORAL | 0 refills | Status: DC

## 2017-03-19 MED ORDER — BENZONATATE 200 MG PO CAPS: 200.0000 mg | ORAL_CAPSULE | Freq: Three times a day (TID) | ORAL | 0 refills | Status: DC | PRN

## 2017-03-19 NOTE — Patient Instructions (Signed)
Start z-pack as directed. Z-pack works for 14 days, even though only 5 day treatment.  Increase tessalon Perles to 200 mg every 8 hours if needed only. New script is 200 mg each tab. Your current dose is 100 mg.  Continue mucinex and hycodan as needed.   Rest, hydrate. Keep higher than desired sugars covered with insulin.

## 2017-03-19 NOTE — Progress Notes (Signed)
Karen Byrd , May 27, 1981, 36 y.o., female MRN: [id number] Patient Care Team    Relationship Specialty Notifications Start End  Karen Fenton, NP PCP - General Internal Medicine  06/29/14     Chief Complaint  Patient presents with  . Ear Pain    left, cough,fatigue x 2 weeks      Subjective: Pt presents for an OV with complaints of left ear pain, cough, fatigue of 2 weeks duration.   Recent treatment with amoxicillin for right otitis media. Patient is a type I diabetic, she has noticed higher glucose readings during acute illness. Patient reports having fits of coughing that will last 15-20 minutes. She was seen in the urgent care and prescribed the amoxicillin, which she had completed. She was then followed by her primary care physician who prescribed Tessalon Perles 100 mg 3 times a day and Hycodan syrup. Patient has not had Hycodan syrup filled as of yet. She does report use of Mucinex.   Depression screen [facility] 2/9 05/03/2015 06/29/2014  Decreased Interest 0 0  Down, Depressed, Hopeless 0 0  PHQ - 2 Score 0 0    Allergies  Allergen Reactions  . Sulfa Antibiotics Hives    "massive hives"   Social History  Substance Use Topics  . Smoking status: Never Smoker  . Smokeless tobacco: Never Used  . Alcohol use Yes     Comment: occasional   Past Medical History:  Diagnosis Date  . Depression   . Diabetes mellitus   . Diabetes mellitus without complication ([place])   . VSD (ventricular septal defect)    Past Surgical History:  Procedure Laterality Date  . APPENDECTOMY    . CHOLECYSTECTOMY    . DILATION AND CURETTAGE OF UTERUS  2014  . TONSILLECTOMY    . VSD REPAIR  May 2000  . VSD REPAIR     Family History  Problem Relation Age of Onset  . Hypertension Mother   . Diabetes Mother   . Hyperlipidemia Father   . Hypertension Father   . Heart disease Maternal Grandfather   . Diabetes Maternal Grandfather   . Stroke Maternal Grandfather   . Hyperlipidemia Paternal  Grandmother   . Heart disease Paternal Grandmother   . Hypertension Paternal Grandmother   . Diabetes Paternal Grandmother   . Stroke Paternal Grandmother   . Hyperlipidemia Paternal Grandfather   . Hypertension Paternal Grandfather   . Stroke Paternal Grandfather   . Cancer Maternal Grandmother        leukemia  . Anesthesia problems Neg Hx   . Hypotension Neg Hx   . Malignant hyperthermia Neg Hx   . Pseudochol deficiency Neg Hx    Allergies as of 03/19/2017      Reactions   Sulfa Antibiotics Hives   "massive hives"      Medication List       Accurate as of 03/19/17  2:45 PM. Always use your most recent med list.          albuterol 108 (90 Base) MCG/ACT inhaler Commonly known as:  PROVENTIL HFA Inhale 2 puffs into the lungs every 6 (six) hours as needed for wheezing or shortness of breath.   BASAGLAR KWIKPEN 100 UNIT/ML Sopn Inject 24 Units into the skin every morning.   benzonatate 100 MG capsule Commonly known as:  TESSALON Take 1 capsule (100 mg total) by mouth 2 (two) times daily as needed for cough.   HYDROcodone-homatropine 5-1.5 MG/5ML syrup Commonly known as:  HYCODAN  Take 5 mLs by mouth at bedtime as needed for cough.   linaclotide 145 MCG Caps capsule Commonly known as:  LINZESS Take by mouth.   NOVOLOG FLEXPEN 100 UNIT/ML FlexPen Generic drug:  insulin aspart Inject 1-10 Units into the skin 3 (three) times daily with meals.   PARAGARD INTRAUTERINE COPPER IU 1 Device by Intrauterine route. Every 10 years--inserted 2014   sertraline 50 MG tablet Commonly known as:  ZOLOFT Take 1 tablet (50 mg total) by mouth daily. SCHEDULE ANNUAL PHYSICAL EXAM       All past medical history, surgical history, allergies, family history, immunizations andmedications were updated in the EMR today and reviewed under the history and medication portions of their EMR.     ROS: Negative, with the exception of above mentioned in HPI   Objective:  BP 112/74 (BP  Location: Left Arm, Patient Position: Sitting, Cuff Size: Normal)   Pulse 81   Temp 98.1 F (36.7 C)   Resp 20   Wt 187 lb (84.8 kg)   LMP 03/07/2017   SpO2 98%   BMI 26.08 kg/m  Body mass index is 26.08 kg/m. Gen: Afebrile. No acute distress. Nontoxic in appearance, well developed, well nourished. Pleasant caucasian female.  HENT: AT. [place]. Bilateral TM visualized without erythema or fullness. MMM, no oral lesions. Bilateral nares with mild swelling, no erythema. Throat without erythema or exudates. Cough present. Mild hoarseness.  Eyes:Pupils Equal Round Reactive to light, Extraocular movements intact,  Conjunctiva without redness, discharge or icterus. Neck/lymp/endocrine: Supple,bilateral ant cervical  lymphadenopathy CV: RRR  Chest: CTAB, no wheeze or crackles. Good air movement, normal resp effort.  Abd: Soft. NTND. BS present.  Skin: no rashes, purpura or petechiae.  Neuro:  Normal gait. PERLA. EOMi. Alert. Oriented x3   No exam data present No results found. No results found for this or any previous visit (from the past 24 hour(s)).  Assessment/Plan: Karen Byrd is a 36 y.o. female present for OV for  Bronchitis - rest, hydrate, mucinex DM. - increase dose of tessalon perles if needed only, discussed max dose. Refilled script. - Consider steroid, but her sugars have been rather elevated from acute illness. - Cover sugars as instructed by endocrine.  - Z-pack prescribed. Cough syndrome could be consistent with pertussis.  - albuterol PRN.  - hycodan at night if able to tolerate (she has a script for this) - F/U with PCP 1 week if not seeing improvement.   Reviewed expectations re: course of current medical issues.  Discussed self-management of symptoms.  Outlined signs and symptoms indicating need for more acute intervention.  Patient verbalized understanding and all questions were answered.  Patient received an After-Visit Summary.     Note is dictated  utilizing voice recognition software. Although note has been proof read prior to signing, occasional typographical errors still can be missed. If any questions arise, please do not hesitate to call for verification.   electronically signed by:  Howard Pouch, DO  [place]

## 2017-06-13 ENCOUNTER — Other Ambulatory Visit: Payer: Self-pay | Admitting: Internal Medicine

## 2017-06-18 ENCOUNTER — Other Ambulatory Visit: Payer: Self-pay | Admitting: Internal Medicine

## 2017-07-12 DIAGNOSIS — E103293 Type 1 diabetes mellitus with mild nonproliferative diabetic retinopathy without macular edema, bilateral: Secondary | ICD-10-CM | POA: Diagnosis not present

## 2017-07-24 ENCOUNTER — Other Ambulatory Visit: Payer: Self-pay | Admitting: Internal Medicine

## 2017-07-24 MED ORDER — SERTRALINE HCL 50 MG PO TABS: 50.0000 mg | ORAL_TABLET | Freq: Every day | ORAL | 0 refills | Status: DC

## 2017-07-24 NOTE — Telephone Encounter (Signed)
email sent to pt notifying her that she will not receive any further refills without an OV for her CPE

## 2017-08-16 ENCOUNTER — Encounter: Payer: Self-pay | Admitting: *Deleted

## 2017-08-19 ENCOUNTER — Encounter: Payer: Self-pay | Admitting: Internal Medicine

## 2017-08-25 DIAGNOSIS — J019 Acute sinusitis, unspecified: Secondary | ICD-10-CM | POA: Diagnosis not present

## 2017-08-30 ENCOUNTER — Other Ambulatory Visit: Payer: Self-pay

## 2017-08-30 ENCOUNTER — Encounter: Payer: Self-pay | Admitting: Family Medicine

## 2017-08-30 ENCOUNTER — Ambulatory Visit: Payer: 59 | Admitting: Family Medicine

## 2017-08-30 DIAGNOSIS — J0100 Acute maxillary sinusitis, unspecified: Secondary | ICD-10-CM | POA: Diagnosis not present

## 2017-08-30 DIAGNOSIS — J329 Chronic sinusitis, unspecified: Secondary | ICD-10-CM | POA: Insufficient documentation

## 2017-08-30 NOTE — Assessment & Plan Note (Signed)
Had some improvement with antibiotics.. No clear ongoing bacterial infection at this time.  Treat with nasal flonase , nasal saline irrigation, mucolytic like mucinex  And zyrtec to cover allergy component. If not improving by early next week.. Consider broadening antibiotics.

## 2017-08-30 NOTE — Progress Notes (Signed)
   Subjective:    Patient ID: Karen Byrd, female    DOB: May 20, 1981, 36 y.o.   MRN: [id number]  Sinusitis  This is a new problem. The current episode started 1 to 4 weeks ago ( 15 days). The problem is unchanged (seen at [facility] urgent care 11/4.Marland Kitchen  treated with z-pack- some better, but not much. She continues to be very fatgiued). Associated symptoms include congestion, coughing, shortness of breath and sinus pressure. Pertinent negatives include no ear pain, headaches or sore throat. (Productive cough,  Fever on first day of antibiotics, none since.  Facial discomfort) Past treatments include antibiotics and oral decongestants (mucinex DM, not taking any allergy meds.). The treatment provided mild relief.   CBGs 300s.. Using sliding scale.  Blood pressure 100/70, pulse 76, temperature 97.7 F (36.5 C), temperature source Oral, height 5\' 11"  (1.803 m), weight 196 lb 4 oz (89 kg).    Review of Systems  HENT: Positive for congestion and sinus pressure. Negative for ear pain and sore throat.   Respiratory: Positive for cough and shortness of breath.   Neurological: Negative for headaches.       Objective:   Physical Exam  Constitutional: Vital signs are normal. She appears well-developed and well-nourished. She is cooperative.  Non-toxic appearance. She does not appear ill. No distress.  HENT:  Head: Normocephalic.  Right Ear: Hearing, tympanic membrane, external ear and ear canal normal. Tympanic membrane is not erythematous, not retracted and not bulging.  Left Ear: Hearing, tympanic membrane, external ear and ear canal normal. Tympanic membrane is not erythematous, not retracted and not bulging.  Nose: Mucosal edema and rhinorrhea present. Right sinus exhibits no maxillary sinus tenderness and no frontal sinus tenderness. Left sinus exhibits no maxillary sinus tenderness and no frontal sinus tenderness.  Mouth/Throat: Uvula is midline, oropharynx is clear and moist and mucous  membranes are normal.  Eyes: Conjunctivae, EOM and lids are normal. Pupils are equal, round, and reactive to light. Lids are everted and swept, no foreign bodies found.  Neck: Trachea normal and normal range of motion. Neck supple. Carotid bruit is not present. No thyroid mass and no thyromegaly present.  Cardiovascular: Normal rate, regular rhythm, S1 normal, S2 normal, normal heart sounds, intact distal pulses and normal pulses. Exam reveals no gallop and no friction rub.  No murmur heard. Pulmonary/Chest: Effort normal and breath sounds normal. No tachypnea. No respiratory distress. She has no decreased breath sounds. She has no wheezes. She has no rhonchi. She has no rales.  Neurological: She is alert.  Skin: Skin is warm, dry and intact. No rash noted.  Psychiatric: Her speech is normal and behavior is normal. Judgment normal. Her mood appears not anxious. Cognition and memory are normal. She does not exhibit a depressed mood.          Assessment & Plan:

## 2017-08-30 NOTE — Patient Instructions (Addendum)
Add flonase 2 sprays per nostril.  Start nasal saline spray 2-3 times daily.  Mucinex DM twice daily. Start zyrtec at bedtime. Call if not improving by early next week or if new fever, or increasing face pain.

## 2017-09-05 ENCOUNTER — Encounter: Payer: Self-pay | Admitting: Internal Medicine

## 2017-09-05 ENCOUNTER — Ambulatory Visit (INDEPENDENT_AMBULATORY_CARE_PROVIDER_SITE_OTHER): Payer: 59 | Admitting: Internal Medicine

## 2017-09-05 VITALS — BP 108/70 | HR 77 | Temp 98.4°F | Ht 70.5 in | Wt 193.0 lb

## 2017-09-05 DIAGNOSIS — K589 Irritable bowel syndrome without diarrhea: Secondary | ICD-10-CM | POA: Insufficient documentation

## 2017-09-05 DIAGNOSIS — F329 Major depressive disorder, single episode, unspecified: Secondary | ICD-10-CM

## 2017-09-05 DIAGNOSIS — Z13220 Encounter for screening for lipoid disorders: Secondary | ICD-10-CM | POA: Diagnosis not present

## 2017-09-05 DIAGNOSIS — K581 Irritable bowel syndrome with constipation: Secondary | ICD-10-CM | POA: Diagnosis not present

## 2017-09-05 DIAGNOSIS — F419 Anxiety disorder, unspecified: Secondary | ICD-10-CM | POA: Diagnosis not present

## 2017-09-05 DIAGNOSIS — E109 Type 1 diabetes mellitus without complications: Secondary | ICD-10-CM | POA: Diagnosis not present

## 2017-09-05 DIAGNOSIS — F32A Depression, unspecified: Secondary | ICD-10-CM

## 2017-09-05 DIAGNOSIS — Z0000 Encounter for general adult medical examination without abnormal findings: Secondary | ICD-10-CM | POA: Diagnosis not present

## 2017-09-05 LAB — COMPREHENSIVE METABOLIC PANEL
ALBUMIN: 4 g/dL (ref 3.5–5.2)
ALT: 13 U/L (ref 0–35)
AST: 18 U/L (ref 0–37)
Alkaline Phosphatase: 48 U/L (ref 39–117)
BUN: 9 mg/dL (ref 6–23)
CHLORIDE: 101 meq/L (ref 96–112)
CO2: 29 mEq/L (ref 19–32)
Calcium: 9.5 mg/dL (ref 8.4–10.5)
Creatinine, Ser: 0.61 mg/dL (ref 0.40–1.20)
GFR: 116.8 mL/min (ref >60.00)
Glucose, Bld: 135 mg/dL — ABNORMAL HIGH (ref 70–99)
POTASSIUM: 3.9 meq/L (ref 3.5–5.1)
SODIUM: 138 meq/L (ref 135–145)
Total Bilirubin: 1.2 mg/dL (ref 0.2–1.2)
Total Protein: 6.8 g/dL (ref 6.0–8.3)

## 2017-09-05 LAB — CBC
HEMATOCRIT: 41.4 % (ref 36.0–46.0)
Hemoglobin: 13.7 g/dL (ref 12.0–15.0)
MCHC: 33.1 g/dL (ref 30.0–36.0)
MCV: 94.4 fl (ref 78.0–100.0)
Platelets: 250 10*3/uL (ref 150.0–400.0)
RBC: 4.38 Mil/uL (ref 3.87–5.11)
RDW: 12.9 % (ref 11.5–15.5)
WBC: 6.1 10*3/uL (ref 4.0–10.5)

## 2017-09-05 LAB — LIPID PANEL
CHOLESTEROL: 229 mg/dL — AB (ref 0–200)
HDL: 74.2 mg/dL (ref >39.00)
LDL Cholesterol: 142 mg/dL — ABNORMAL HIGH (ref 0–99)
NONHDL: 154.67
TRIGLYCERIDES: 61 mg/dL (ref 0.0–149.0)
Total CHOL/HDL Ratio: 3
VLDL: 12.2 mg/dL (ref 0.0–40.0)

## 2017-09-05 LAB — MICROALBUMIN / CREATININE URINE RATIO
Creatinine,U: 240.1 mg/dL
MICROALB/CREAT RATIO: 0.4 mg/g (ref 0.0–30.0)
Microalb, Ur: 0.9 mg/dL (ref 0.0–1.9)

## 2017-09-05 LAB — HEMOGLOBIN A1C: HEMOGLOBIN A1C: 9.6 % — AB (ref 4.6–6.5)

## 2017-09-05 MED ORDER — NOVOLOG FLEXPEN 100 UNIT/ML ~~LOC~~ SOPN
1.0000 [IU] | PEN_INJECTOR | Freq: Three times a day (TID) | SUBCUTANEOUS | 1 refills | Status: DC
Start: 2017-09-05 — End: 2017-11-21

## 2017-09-05 NOTE — Progress Notes (Signed)
Subjective:    Patient ID: Karen Byrd, female    DOB: [birth year], 36 y.o.   MRN: [id number]  HPI  Pt presents to the clinic today for her annual exam. She is also due to follow up chronic conditions.  DM 1: She reports she has not had an A1C in a long time. She checks her sugars intermittently but not as routinely as she should. She is taking Consulting civil engineer as prescribed. She denies hypoglycemia. She checks her feet daily. She has had an eye exam within the last year. She does not take flu or pneumonia vaccines. She follows with [facility], but would like a referral to someone closer in this area.   IBS: Mainly constipation. She takes Linzess daily and reports with that, she only has a BM about 1 x week.  Anxiety and Depression: Chronic but stable on Zoloft. She is not interested in weaning off this medication at this time.  Flu: 2014 Tetanus: unsure Pneumovax: never Pap Smear: 2015, Dr. Howell Rucks at [facility]: annually, [facility]: as needed  Diet: She does eat meat. She consumes fruits and veggies daily. She occassionally eats fried foods. She drinks mostly water. Exercise: She walk a few days per week.   Review of Systems      Past Medical History:  Diagnosis Date  . Depression   . Diabetes mellitus   . Diabetes mellitus without complication ([place])   . VSD (ventricular septal defect)     Current Outpatient Medications  Medication Sig Dispense Refill  . ACZONE 7.5 % GEL APPLY TO THE AFFECTED AREA EVERY DAY  3  . Insulin Glargine (BASAGLAR KWIKPEN) 100 UNIT/ML SOPN Inject 24 Units into the skin every morning.    . linaclotide (LINZESS) 145 MCG CAPS capsule Take by mouth.    Marland Kitchen NOVOLOG FLEXPEN 100 UNIT/ML FlexPen Inject 1-10 Units into the skin 3 (three) times daily with meals. 15 mL 1  . PARAGARD INTRAUTERINE COPPER IU 1 Device by Intrauterine route. Every 10 years--inserted 2014    . sertraline (ZOLOFT) 50 MG tablet Take 1 tablet (50 mg  total) by mouth daily. LAST REFILL MUST SCHEDULE PHYSICAL (Patient taking differently: Take 50 mg daily by mouth. ) 90 tablet 0   No current facility-administered medications for this visit.     Allergies  Allergen Reactions  . Sulfa Antibiotics Hives    "massive hives"    Family History  Problem Relation Age of Onset  . Hypertension Mother   . Diabetes Mother   . Hyperlipidemia Father   . Hypertension Father   . Heart disease Maternal Grandfather   . Diabetes Maternal Grandfather   . Stroke Maternal Grandfather   . Hyperlipidemia Paternal Grandmother   . Heart disease Paternal Grandmother   . Hypertension Paternal Grandmother   . Diabetes Paternal Grandmother   . Stroke Paternal Grandmother   . Hyperlipidemia Paternal Grandfather   . Hypertension Paternal Grandfather   . Stroke Paternal Grandfather   . Cancer Maternal Grandmother        leukemia  . Anesthesia problems Neg Hx   . Hypotension Neg Hx   . Malignant hyperthermia Neg Hx   . Pseudochol deficiency Neg Hx     Social History   Socioeconomic History  . Marital status: Married    Spouse name: Not on file  . Number of children: Not on file  . Years of education: Not on file  . Highest education level: Not on file  Social Needs  . Financial resource strain: Not on file  . Food insecurity - worry: Not on file  . Food insecurity - inability: Not on file  . Transportation needs - medical: Not on file  . Transportation needs - non-medical: Not on file  Occupational History  . Not on file  Tobacco Use  . Smoking status: Never Smoker  . Smokeless tobacco: Never Used  Substance and Sexual Activity  . Alcohol use: Yes    Comment: occasional  . Drug use: No  . Sexual activity: Yes    Birth control/protection: IUD  Other Topics Concern  . Not on file  Social History Narrative   ** Merged History Encounter **         Constitutional: Denies fever, malaise, fatigue, headache or abrupt weight changes.    HEENT: Denies eye pain, eye redness, ear pain, ringing in the ears, wax buildup, runny nose, nasal congestion, bloody nose, or sore throat. Respiratory: Pt reports cough. Denies difficulty breathing, shortness of breath, or sputum production.   Cardiovascular: Denies chest pain, chest tightness, palpitations or swelling in the hands or feet.  Gastrointestinal: Denies abdominal pain, bloating, constipation, diarrhea or blood in the stool.  GU: Denies urgency, frequency, pain with urination, burning sensation, blood in urine, odor or discharge. Musculoskeletal: Pt reports intermittent low back pain. Denies decrease in range of motion, difficulty with gait, muscle pain or joint swelling.  Skin: Denies redness, rashes, lesions or ulcercations.  Neurological: Denies dizziness, difficulty with memory, difficulty with speech or problems with balance and coordination.  Psych: Pt has a history of anxiety and depression. Denies SI/HI.  No other specific complaints in a complete review of systems (except as listed in HPI above).  Objective:   Physical Exam  BP 108/70   Pulse 77   Temp 98.4 F (36.9 C) (Oral)   Ht 5' 10.5" (1.791 m)   Wt 193 lb (87.5 kg)   SpO2 97%   BMI 27.30 kg/m  Wt Readings from Last 3 Encounters:  09/05/17 193 lb (87.5 kg)  08/30/17 196 lb 4 oz (89 kg)  03/19/17 187 lb (84.8 kg)    General: Appears her stated age, well developed, well nourished in NAD. Skin: Warm, dry and intact. No ulcerations noted. HEENT: Head: normal shape and size; Eyes: sclera white, no icterus, conjunctiva pink, PERRLA and EOMs intact; Ears: Tm's gray and intact, normal light reflex; Throat/Mouth: Teeth present, mucosa pink and moist, no exudate, lesions or ulcerations noted.  Neck:  Neck supple, trachea midline. No masses, lumps or thyromegaly present.  Cardiovascular: Normal rate and rhythm. S1,S2 noted.  No murmur, rubs or gallops noted. No JVD or BLE edema.  Pulmonary/Chest: Normal effort and  positive vesicular breath sounds. No respiratory distress. No wheezes, rales or ronchi noted.  Abdomen: Soft and nontender. Hypoactive bowel sounds. No distention or masses noted. Liver, spleen and kidneys non palpable. Musculoskeletal: Strength 5/ 5BUE/BLE. No difficulty with gait.  Neurological: Alert and oriented. Cranial nerves II-XII grossly intact. Coordination normal.  Psychiatric: Mood and affect normal. Behavior is normal. Judgment and thought content normal.     BMET    Component Value Date/Time   NA 134 (L) 03/20/2016 1100   K 4.1 03/20/2016 1100   CL 103 03/20/2016 1100   CO2 24 03/20/2016 1100   GLUCOSE 360 (H) 03/20/2016 1100   BUN 6 03/20/2016 1100   CREATININE 0.63 03/20/2016 1100   CALCIUM 8.7 (L) 03/20/2016 1100   GFRNONAA >60 03/20/2016 1100  GFRAA >60 03/20/2016 1100    Lipid Panel     Component Value Date/Time   CHOL 194 05/03/2015 0931   TRIG 74.0 05/03/2015 0931   HDL 65.80 05/03/2015 0931   CHOLHDL 3 05/03/2015 0931   VLDL 14.8 05/03/2015 0931   LDLCALC 113 (H) 05/03/2015 0931    CBC    Component Value Date/Time   WBC 6.4 03/20/2016 1100   RBC 4.35 03/20/2016 1100   HGB 13.2 03/20/2016 1100   HCT 40.3 03/20/2016 1100   PLT 264 03/20/2016 1100   MCV 92.6 03/20/2016 1100   MCH 30.3 03/20/2016 1100   MCHC 32.8 03/20/2016 1100   RDW 12.3 03/20/2016 1100    Hgb A1C Lab Results  Component Value Date   HGBA1C 8.7 (H) 05/03/2015            Assessment & Plan:   Preventative Health Maintenance:  She declines flu, tetanus or pneumovax She will schedule her pap smear with [facility] Encouraged her to consume a balanced diet and exercise regimen Advised her to see an eye doctor and dentist annually Will check CBC, CMET, Lipid, A1C and microalbumin today  RTC in 1 year, sooner if needed Webb Silversmith, NP

## 2017-09-05 NOTE — Assessment & Plan Note (Addendum)
A1C and microalbumin today Encouraged her to consume a low fat, low carb diet and exercise regularly Discussed the importance of checking her sugars 5 x day Continue Basaglar and Novolog Foot exam today Encouraged yearly eye exams She declines flu or pneumovax today Referral to endo placed

## 2017-09-05 NOTE — Assessment & Plan Note (Signed)
Chronic but stable on Zoloft Wean not indicated CMET today

## 2017-09-05 NOTE — Patient Instructions (Signed)

## 2017-09-05 NOTE — Assessment & Plan Note (Signed)
Mainly constipation Discussed high fiber diet, lots of water Continue Linzess for now She will continue to follow with GI

## 2017-09-10 ENCOUNTER — Telehealth: Payer: Self-pay

## 2017-09-10 NOTE — Telephone Encounter (Signed)
PA has been submitted via [email or web address], awaiting response  °

## 2017-09-18 NOTE — Telephone Encounter (Signed)
Received fax stating the PA has been approved through 09/10/2018 [id number]

## 2017-11-04 DIAGNOSIS — E103293 Type 1 diabetes mellitus with mild nonproliferative diabetic retinopathy without macular edema, bilateral: Secondary | ICD-10-CM | POA: Diagnosis not present

## 2017-11-04 DIAGNOSIS — E1069 Type 1 diabetes mellitus with other specified complication: Secondary | ICD-10-CM | POA: Diagnosis not present

## 2017-11-04 DIAGNOSIS — E11649 Type 2 diabetes mellitus with hypoglycemia without coma: Secondary | ICD-10-CM | POA: Diagnosis not present

## 2017-11-09 DIAGNOSIS — J09X2 Influenza due to identified novel influenza A virus with other respiratory manifestations: Secondary | ICD-10-CM | POA: Diagnosis not present

## 2017-11-09 DIAGNOSIS — R509 Fever, unspecified: Secondary | ICD-10-CM | POA: Diagnosis not present

## 2017-11-21 ENCOUNTER — Other Ambulatory Visit: Payer: Self-pay

## 2017-11-21 MED ORDER — NOVOLOG FLEXPEN 100 UNIT/ML ~~LOC~~ SOPN: 1.0000 [IU] | PEN_INJECTOR | Freq: Three times a day (TID) | SUBCUTANEOUS | 1 refills | Status: DC

## 2017-11-21 NOTE — Telephone Encounter (Signed)
Pt was seen for CPE 08/2017 A1C was 9.6% and you advised pt to f/u with [facility] and pt has not... Please advise if okay to refill or if pt needs to f/u

## 2017-12-02 DIAGNOSIS — L308 Other specified dermatitis: Secondary | ICD-10-CM | POA: Diagnosis not present

## 2017-12-02 DIAGNOSIS — D225 Melanocytic nevi of trunk: Secondary | ICD-10-CM | POA: Diagnosis not present

## 2017-12-02 DIAGNOSIS — L700 Acne vulgaris: Secondary | ICD-10-CM | POA: Diagnosis not present

## 2017-12-11 ENCOUNTER — Encounter: Payer: Self-pay | Admitting: Internal Medicine

## 2017-12-11 DIAGNOSIS — E1065 Type 1 diabetes mellitus with hyperglycemia: Secondary | ICD-10-CM | POA: Diagnosis not present

## 2017-12-12 DIAGNOSIS — E1065 Type 1 diabetes mellitus with hyperglycemia: Secondary | ICD-10-CM | POA: Diagnosis not present

## 2018-01-02 DIAGNOSIS — R1310 Dysphagia, unspecified: Secondary | ICD-10-CM | POA: Diagnosis not present

## 2018-01-02 DIAGNOSIS — K5900 Constipation, unspecified: Secondary | ICD-10-CM | POA: Diagnosis not present

## 2018-02-14 ENCOUNTER — Other Ambulatory Visit: Payer: Self-pay | Admitting: Internal Medicine

## 2018-02-17 NOTE — Telephone Encounter (Signed)
Hasn't been filled since 11/09/17. Is an appointment needed for further refills?

## 2018-02-17 NOTE — Telephone Encounter (Signed)
Ok to refill [employer]

## 2018-03-21 DIAGNOSIS — E1065 Type 1 diabetes mellitus with hyperglycemia: Secondary | ICD-10-CM | POA: Diagnosis not present

## 2018-03-21 DIAGNOSIS — E103293 Type 1 diabetes mellitus with mild nonproliferative diabetic retinopathy without macular edema, bilateral: Secondary | ICD-10-CM | POA: Diagnosis not present

## 2018-03-24 ENCOUNTER — Telehealth: Payer: Self-pay

## 2018-03-24 NOTE — Telephone Encounter (Signed)
This was my chart note pt left;  From: Karen Byrd  Sent: 03/23/2018 10:26 PM  To: Juanetta Beets Admin Pool  Subject: Appointment scheduled from [place]          Appointment For: Karen Byrd ([id number])  Visit Type: MYCHART OFFICE VISIT ([id number])    03/25/2018   8:45 AM 15 mins. Jearld Fenton, NP    [facility]    Patient Comments:  I often wake up clinching my fists or holding my arm in an odd position. The other morning I woke up with a tingling sensation in my arm. After inspection, I noticed that there is a substantial lump in my left forearm that doesn't seem to be muscular.   I called and spoke with pt; she had no injury and is having no CP, SOB, redness or swelling aroung 1/2 dollar sized lump on lt forearm that pt noticed last week. Pt will come 03/25/18 at 8:30 to ck in at [facility] for 8:45 appt. If condition changes or worsens prior to appt pt will go to ED. FYI to Avie Echevaria NP who is out of office and Dr Silvio Pate for review.

## 2018-03-24 NOTE — Telephone Encounter (Signed)
Certainly seems to be something that can wait for tomorrow

## 2018-03-25 ENCOUNTER — Ambulatory Visit: Payer: 59 | Admitting: Internal Medicine

## 2018-03-25 ENCOUNTER — Encounter: Payer: Self-pay | Admitting: Internal Medicine

## 2018-03-25 VITALS — BP 106/64 | HR 70 | Temp 97.9°F | Wt 199.0 lb

## 2018-03-25 DIAGNOSIS — L720 Epidermal cyst: Secondary | ICD-10-CM

## 2018-03-25 DIAGNOSIS — R200 Anesthesia of skin: Secondary | ICD-10-CM

## 2018-03-25 NOTE — Progress Notes (Signed)
Subjective:    Patient ID: Karen Byrd, female    DOB: 04-Mar-1981, 37 y.o.   MRN: [id number]  HPI  Pt presents to the clinic today with c/o a mass of the left forearm. She noticed this 1 week ago. The mass has not gotten bigger in size. It is not tender. She has not tried anything OTC.  She also reports bilateral hand numbness. She reports this has been intermittent of the last month. She reports she will intermittently wake up in the morning with her fists clenched. When this happens, she has numbness in her hands. She does not have numbness or tingling in her hands any other time. She is under a lot of stress, but is not sure if that is a contributing factor. She denies neck or shoulder pain. She has not taken anything OTC.  Review of Systems      Past Medical History:  Diagnosis Date  . Depression   . Diabetes mellitus   . Diabetes mellitus without complication ([place])   . VSD (ventricular septal defect)     Current Outpatient Medications  Medication Sig Dispense Refill  . ACZONE 7.5 % GEL APPLY TO THE AFFECTED AREA EVERY DAY  3  . insulin aspart (NOVOLOG) 100 UNIT/ML injection Up to 60 units in insulin pump daily. F57.3220    . linaclotide (LINZESS) 145 MCG CAPS capsule Take by mouth.    Marland Kitchen PARAGARD INTRAUTERINE COPPER IU 1 Device by Intrauterine route. Every 10 years--inserted 2014    . sertraline (ZOLOFT) 50 MG tablet Take 1 tablet (50 mg total) by mouth daily. LAST REFILL MUST SCHEDULE PHYSICAL (Patient taking differently: Take 50 mg daily by mouth. ) 90 tablet 0   No current facility-administered medications for this visit.     Allergies  Allergen Reactions  . Sulfa Antibiotics Hives    "massive hives"    Family History  Problem Relation Age of Onset  . Hypertension Mother   . Diabetes Mother   . Hyperlipidemia Father   . Hypertension Father   . Heart disease Maternal Grandfather   . Diabetes Maternal Grandfather   . Stroke Maternal Grandfather   .  Hyperlipidemia Paternal Grandmother   . Heart disease Paternal Grandmother   . Hypertension Paternal Grandmother   . Diabetes Paternal Grandmother   . Stroke Paternal Grandmother   . Hyperlipidemia Paternal Grandfather   . Hypertension Paternal Grandfather   . Stroke Paternal Grandfather   . Cancer Maternal Grandmother        leukemia  . Anesthesia problems Neg Hx   . Hypotension Neg Hx   . Malignant hyperthermia Neg Hx   . Pseudochol deficiency Neg Hx     Social History   Socioeconomic History  . Marital status: Married    Spouse name: Not on file  . Number of children: Not on file  . Years of education: Not on file  . Highest education level: Not on file  Occupational History  . Not on file  Social Needs  . Financial resource strain: Not on file  . Food insecurity:    Worry: Not on file    Inability: Not on file  . Transportation needs:    Medical: Not on file    Non-medical: Not on file  Tobacco Use  . Smoking status: Never Smoker  . Smokeless tobacco: Never Used  Substance and Sexual Activity  . Alcohol use: Yes    Comment: occasional  . Drug use: No  . Sexual activity:  Yes    Birth control/protection: IUD  Lifestyle  . Physical activity:    Days per week: Not on file    Minutes per session: Not on file  . Stress: Not on file  Relationships  . Social connections:    Talks on phone: Not on file    Gets together: Not on file    Attends religious service: Not on file    Active member of club or organization: Not on file    Attends meetings of clubs or organizations: Not on file    Relationship status: Not on file  . Intimate partner violence:    Fear of current or ex partner: Not on file    Emotionally abused: Not on file    Physically abused: Not on file    Forced sexual activity: Not on file  Other Topics Concern  . Not on file  Social History Narrative   ** Merged History Encounter **         Constitutional: Denies fever, malaise, fatigue,  headache or abrupt weight changes.  Musculoskeletal: Denies decrease in range of motion, difficulty with gait, muscle pain or joint pain and swelling.  Skin: Pt reports mass of left forearm. Denies redness, rashes, or ulcercations.  Neurological: Pt reports involuntary clenching of fist, hand numbness. Denies dizziness, difficulty with memory, difficulty with speech or problems with balance and coordination.  Psych: Pt reports stress. Denies anxiety, depression, SI/HI.  No other specific complaints in a complete review of systems (except as listed in HPI above).  Objective:   Physical Exam   BP 106/64   Pulse 70   Temp 97.9 F (36.6 C) (Oral)   Wt 199 lb (90.3 kg)   SpO2 98%   BMI 28.15 kg/m  Wt Readings from Last 3 Encounters:  03/25/18 199 lb (90.3 kg)  09/05/17 193 lb (87.5 kg)  08/30/17 196 lb 4 oz (89 kg)    General: Appears her stated age, well developed, well nourished in NAD. Skin: 1 cm soft, mobile, cystic mass noted of left anterior forearm. Cardiovascular: Radial pulses 2+ bilaterally. Cap refill < 3 secs. Musculoskeletal: No bony tenderness noted over the cervical spine. Tension noted in the trapezius bilaterally. Normal flexion, extension and rotation of the wrist bilaterally. No joint swelling. Hand grips equal.  Neurological: Alert and oriented. Sensation intact to BUE. Psychiatric: Mood and affect normal. Behavior is normal.  BMET    Component Value Date/Time   NA 138 09/05/2017 0918   K 3.9 09/05/2017 0918   CL 101 09/05/2017 0918   CO2 29 09/05/2017 0918   GLUCOSE 135 (H) 09/05/2017 0918   BUN 9 09/05/2017 0918   CREATININE 0.61 09/05/2017 0918   CALCIUM 9.5 09/05/2017 0918   GFRNONAA >60 03/20/2016 1100   GFRAA >60 03/20/2016 1100    Lipid Panel     Component Value Date/Time   CHOL 229 (H) 09/05/2017 0918   TRIG 61.0 09/05/2017 0918   HDL 74.20 09/05/2017 0918   CHOLHDL 3 09/05/2017 0918   VLDL 12.2 09/05/2017 0918   LDLCALC 142 (H) 09/05/2017  0918    CBC    Component Value Date/Time   WBC 6.1 09/05/2017 0918   RBC 4.38 09/05/2017 0918   HGB 13.7 09/05/2017 0918   HCT 41.4 09/05/2017 0918   PLT 250.0 09/05/2017 0918   MCV 94.4 09/05/2017 0918   MCH 30.3 03/20/2016 1100   MCHC 33.1 09/05/2017 0918   RDW 12.9 09/05/2017 0918    Hgb A1C  Lab Results  Component Value Date   HGBA1C 9.6 (H) 09/05/2017          Assessment & Plan:   Cyst of Left Forearm:  Not causing any pain No s/s of infection Will monitor for now  Involuntary Clenching of Fists, Numbness of Hands:  Only occurs during sleep She does not use a computer, concern for carpal tunnel low Offered to check B12, Vit D, TSH but she declines at this time ? Stress related, offered RX for muscle relaxer but she declines at this time  Return precautions discussed Webb Silversmith, NP

## 2018-04-25 ENCOUNTER — Telehealth: Payer: 59 | Admitting: Family

## 2018-04-25 DIAGNOSIS — A049 Bacterial intestinal infection, unspecified: Secondary | ICD-10-CM

## 2018-04-25 MED ORDER — CIPROFLOXACIN HCL 500 MG PO TABS: 500.0000 mg | ORAL_TABLET | Freq: Two times a day (BID) | ORAL | 0 refills | Status: DC

## 2018-04-25 MED ORDER — ONDANSETRON 4 MG PO TBDP: 4.0000 mg | ORAL_TABLET | Freq: Three times a day (TID) | ORAL | 0 refills | Status: DC | PRN

## 2018-04-25 NOTE — Progress Notes (Signed)
Thank you for the details you included in the comment boxes. Those details are very helpful in determining the best course of treatment for you and help [place] to provide the best care.  We are sorry that you are not feeling well.  Here is how we plan to help!  Based on what you have shared with me it looks like you have Acute Infectious Diarrhea.  Most cases of acute diarrhea are due to infections with virus and bacteria and are self-limited conditions lasting less than 14 days.  For your symptoms you may take Imodium 2 mg tablets that are over the counter at your local pharmacy. Take two tablet now and then one after each loose stool up to 6 a day.  Antibiotics are not needed for most people with diarrhea.  Optional: Zofran 4 mg 1 tablet every 8 hours as needed for nausea and vomiting  Optional: I have sent in Cipro 500 mg two tablets twice a day for five days given the uncertainty of exposures to various illnesses with your husband and then passing this to you.   HOME CARE  We recommend changing your diet to help with your symptoms for the next few days.  Drink plenty of fluids that contain water salt and sugar. Sports drinks such as Gatorade may help.   You may try broths, soups, bananas, applesauce, soft breads, mashed potatoes or crackers.   You are considered infectious for as long as the diarrhea continues. Hand washing or use of alcohol based hand sanitizers is recommend.  It is best to stay out of work or school until your symptoms stop.   GET HELP RIGHT AWAY  If you have dark yellow colored urine or do not pass urine frequently you should drink more fluids.    If your symptoms worsen   If you feel like you are going to pass out (faint)  You have a new problem  MAKE SURE YOU   Understand these instructions.  Will watch your condition.  Will get help right away if you are not doing well or get worse.  Your e-visit answers were reviewed by a board certified advanced  clinical practitioner to complete your personal care plan.  Depending on the condition, your plan could have included both over the counter or prescription medications.  If there is a problem please reply  once you have received a response from your provider.  Your safety is important to [place].  If you have drug allergies check your prescription carefully.    You can use MyChart to ask questions about today's visit, request a non-urgent call back, or ask for a work or school excuse for 24 hours related to this e-Visit. If it has been greater than 24 hours you will need to follow up with your provider, or enter a new e-Visit to address those concerns.   You will get an e-mail in the next two days asking about your experience.  I hope that your e-visit has been valuable and will speed your recovery. Thank you for using e-visits.

## 2018-04-28 ENCOUNTER — Other Ambulatory Visit: Payer: Self-pay | Admitting: Internal Medicine

## 2018-04-29 ENCOUNTER — Ambulatory Visit: Payer: 59 | Admitting: Anesthesiology

## 2018-04-29 ENCOUNTER — Ambulatory Visit
Admission: RE | Admit: 2018-04-29 | Discharge: 2018-04-29 | Disposition: A | Discharge status: Home / Self Care | Payer: 59 | Source: Ambulatory Visit | Attending: Internal Medicine | Admitting: Internal Medicine

## 2018-04-29 ENCOUNTER — Encounter
Admission: RE | Disposition: A | Discharge status: Home / Self Care | Payer: Self-pay | Source: Ambulatory Visit | Attending: Internal Medicine

## 2018-04-29 DIAGNOSIS — R1310 Dysphagia, unspecified: Secondary | ICD-10-CM | POA: Diagnosis not present

## 2018-04-29 DIAGNOSIS — K219 Gastro-esophageal reflux disease without esophagitis: Secondary | ICD-10-CM | POA: Diagnosis not present

## 2018-04-29 DIAGNOSIS — K644 Residual hemorrhoidal skin tags: Secondary | ICD-10-CM | POA: Diagnosis not present

## 2018-04-29 DIAGNOSIS — Z794 Long term (current) use of insulin: Secondary | ICD-10-CM | POA: Insufficient documentation

## 2018-04-29 DIAGNOSIS — F419 Anxiety disorder, unspecified: Secondary | ICD-10-CM | POA: Diagnosis not present

## 2018-04-29 DIAGNOSIS — K2950 Unspecified chronic gastritis without bleeding: Secondary | ICD-10-CM | POA: Diagnosis not present

## 2018-04-29 DIAGNOSIS — K2980 Duodenitis without bleeding: Secondary | ICD-10-CM | POA: Diagnosis not present

## 2018-04-29 DIAGNOSIS — F329 Major depressive disorder, single episode, unspecified: Secondary | ICD-10-CM | POA: Diagnosis not present

## 2018-04-29 DIAGNOSIS — K2990 Gastroduodenitis, unspecified, without bleeding: Secondary | ICD-10-CM | POA: Diagnosis not present

## 2018-04-29 DIAGNOSIS — K640 First degree hemorrhoids: Secondary | ICD-10-CM | POA: Diagnosis not present

## 2018-04-29 DIAGNOSIS — Z882 Allergy status to sulfonamides status: Secondary | ICD-10-CM | POA: Diagnosis not present

## 2018-04-29 DIAGNOSIS — Z79899 Other long term (current) drug therapy: Secondary | ICD-10-CM | POA: Insufficient documentation

## 2018-04-29 DIAGNOSIS — R194 Change in bowel habit: Secondary | ICD-10-CM | POA: Diagnosis not present

## 2018-04-29 DIAGNOSIS — K209 Esophagitis, unspecified: Secondary | ICD-10-CM | POA: Diagnosis not present

## 2018-04-29 DIAGNOSIS — K648 Other hemorrhoids: Secondary | ICD-10-CM | POA: Diagnosis not present

## 2018-04-29 DIAGNOSIS — K200 Eosinophilic esophagitis: Secondary | ICD-10-CM | POA: Diagnosis not present

## 2018-04-29 DIAGNOSIS — E109 Type 1 diabetes mellitus without complications: Secondary | ICD-10-CM | POA: Insufficient documentation

## 2018-04-29 HISTORY — PX: COLONOSCOPY WITH PROPOFOL: SHX5780

## 2018-04-29 HISTORY — DX: Dysphagia, unspecified: R13.10

## 2018-04-29 HISTORY — PX: ESOPHAGOGASTRODUODENOSCOPY (EGD) WITH PROPOFOL: SHX5813

## 2018-04-29 HISTORY — DX: Constipation, unspecified: K59.00

## 2018-04-29 LAB — GLUCOSE, CAPILLARY: GLUCOSE-CAPILLARY: 175 mg/dL — AB (ref 70–99)

## 2018-04-29 LAB — POCT PREGNANCY, URINE: Preg Test, Ur: NEGATIVE

## 2018-04-29 SURGERY — COLONOSCOPY WITH PROPOFOL
Anesthesia: General

## 2018-04-29 MED ORDER — PROPOFOL 500 MG/50ML IV EMUL
INTRAVENOUS | Status: AC
  Filled 2018-04-29: qty 50

## 2018-04-29 MED ORDER — SODIUM CHLORIDE 0.9 % IV SOLN
INTRAVENOUS | Status: DC
  Administered 2018-04-29: 08:00:00 via INTRAVENOUS

## 2018-04-29 MED ORDER — SODIUM CHLORIDE 0.9 % IV SOLN
INTRAVENOUS | Status: DC | PRN
  Administered 2018-04-29: 08:00:00 via INTRAVENOUS

## 2018-04-29 MED ORDER — PROPOFOL 10 MG/ML IV BOLUS
INTRAVENOUS | Status: DC | PRN
  Administered 2018-04-29 (×3): 50 mg via INTRAVENOUS

## 2018-04-29 MED ORDER — PROPOFOL 500 MG/50ML IV EMUL
INTRAVENOUS | Status: DC | PRN
  Administered 2018-04-29: 100 ug/kg/min via INTRAVENOUS

## 2018-04-29 NOTE — H&P (Signed)
Outpatient short stay form Pre-procedure 04/29/2018 8:06 AM Virlan Kempker K. Alice Reichert, M.D.  Primary Physician: Webb Silversmith, M.D.  Reason for visit:  Dysphagia, GERD, Constipation, change in bowel habits  History of present illness: 37 y/o female with Type I DM presents for constipation partially responsive to Linzess 145 mcg daily. She has intermittent GERD when constipated and has dysphagia localized to the throat area intermittently without vomiting to both liquids and solids.     Current Facility-Administered Medications:  .  0.9 %  sodium chloride infusion, , Intravenous, Continuous, [place], Benay Pike, MD, Last Rate: 20 mL/hr at 04/29/18 6333  Medications Prior to Admission  Medication Sig Dispense Refill Last Dose  . ACZONE 7.5 % GEL APPLY TO THE AFFECTED AREA EVERY DAY  3 04/28/2018 at Unknown time  . insulin aspart (NOVOLOG) 100 UNIT/ML injection Up to 60 units in insulin pump daily. L45.6256   04/29/2018 at Unknown time  . ondansetron (ZOFRAN ODT) 4 MG disintegrating tablet Take 1 tablet (4 mg total) by mouth every 8 (eight) hours as needed for nausea or vomiting. 20 tablet 0 Past Week at Unknown time  . ciprofloxacin (CIPRO) 500 MG tablet Take 1 tablet (500 mg total) by mouth 2 (two) times daily. 10 tablet 0 04/27/2018  . linaclotide (LINZESS) 145 MCG CAPS capsule Take by mouth.   04/27/2018  . PARAGARD INTRAUTERINE COPPER IU 1 Device by Intrauterine route. Every 10 years--inserted 2014   Taking  . sertraline (ZOLOFT) 50 MG tablet Take 1 tablet (50 mg total) by mouth daily. LAST REFILL MUST SCHEDULE PHYSICAL (Patient taking differently: Take 50 mg daily by mouth. ) 90 tablet 0 04/27/2018     Allergies  Allergen Reactions  . Sulfa Antibiotics Hives    "massive hives"     Past Medical History:  Diagnosis Date  . Constipation   . Depression   . Diabetes mellitus   . Diabetes mellitus without complication ([place])   . Dysphagia   . VSD (ventricular septal defect)     Review of systems:   Otherwise negative.    Physical Exam  Gen: Alert, oriented. Appears stated age.  HEENT: [place]/AT. PERRLA. Lungs: CTA, no wheezes. CV: RR nl S1, S2. Abd: soft, benign, no masses. BS+ Ext: No edema. Pulses 2+    Planned procedures: Proceed with EGD and colonoscopy. The patient understands the nature of the planned procedure, indications, risks, alternatives and potential complications including but not limited to bleeding, infection, perforation, damage to internal organs and possible oversedation/side effects from anesthesia. The patient agrees and gives consent to proceed.  Please refer to procedure notes for findings, recommendations and patient disposition/instructions.     Michaeljames Milnes K. Alice Reichert, M.D. Gastroenterology 04/29/2018  8:06 AM

## 2018-04-29 NOTE — Anesthesia Postprocedure Evaluation (Signed)
Anesthesia Post Note  Patient: Karen Byrd  Procedure(s) Performed: COLONOSCOPY WITH PROPOFOL (N/A ) ESOPHAGOGASTRODUODENOSCOPY (EGD) WITH PROPOFOL (N/A )  Patient location during evaluation: Endoscopy Anesthesia Type: General Level of consciousness: awake and alert Pain management: pain level controlled Vital Signs Assessment: post-procedure vital signs reviewed and stable Respiratory status: spontaneous breathing, nonlabored ventilation, respiratory function stable and patient connected to nasal cannula oxygen Cardiovascular status: blood pressure returned to baseline and stable Postop Assessment: no apparent nausea or vomiting Anesthetic complications: no     Last Vitals:  Vitals:   04/29/18 0848 04/29/18 0849  BP: (!) 97/40 (!) 97/40  Pulse: 76 77  Resp: 18 17  Temp: (!) 36.1 C 36.8 C  SpO2: 100% 100%    Last Pain:  Vitals:   04/29/18 0909  TempSrc:   PainSc: 0-No pain                 [name] S

## 2018-04-29 NOTE — Transfer of Care (Signed)
Immediate Anesthesia Transfer of Care Note  Patient: Karen Byrd  Procedure(s) Performed: COLONOSCOPY WITH PROPOFOL (N/A ) ESOPHAGOGASTRODUODENOSCOPY (EGD) WITH PROPOFOL (N/A )  Patient Location: PACU  Anesthesia Type:General  Level of Consciousness: awake, alert  and oriented  Airway & Oxygen Therapy: Patient Spontanous Breathing  Post-op Assessment: Report given to RN  Post vital signs: Reviewed and stable  Last Vitals:  Vitals Value Taken Time  BP 97/40 04/29/2018  8:48 AM  Temp    Pulse 77 04/29/2018  8:49 AM  Resp 17 04/29/2018  8:49 AM  SpO2 100 % 04/29/2018  8:49 AM  Vitals shown include unvalidated device data.  Last Pain:  Vitals:   04/29/18 0715  TempSrc: Tympanic  PainSc: 0-No pain         Complications: No apparent anesthesia complications

## 2018-04-29 NOTE — Anesthesia Preprocedure Evaluation (Signed)
Anesthesia Evaluation  Patient identified by MRN, date of birth, ID band Patient awake    Reviewed: Allergy & Precautions, NPO status , Patient's Chart, lab work & pertinent test results, reviewed documented beta blocker date and time   Airway Mallampati: II  TM Distance: >3 FB     Dental  (+) Chipped   Pulmonary           Cardiovascular      Neuro/Psych PSYCHIATRIC DISORDERS Anxiety Depression    GI/Hepatic   Endo/Other  diabetes, Type 1  Renal/GU      Musculoskeletal   Abdominal   Peds  Hematology   Anesthesia Other Findings   Reproductive/Obstetrics                             Anesthesia Physical Anesthesia Plan  ASA: II  Anesthesia Plan: General   Post-op Pain Management:    Induction: Intravenous  PONV Risk Score and Plan:   Airway Management Planned:   Additional Equipment:   Intra-op Plan:   Post-operative Plan:   Informed Consent: I have reviewed the patients History and Physical, chart, labs and discussed the procedure including the risks, benefits and alternatives for the proposed anesthesia with the patient or authorized representative who has indicated his/her understanding and acceptance.     Plan Discussed with: CRNA  Anesthesia Plan Comments:         Anesthesia Quick Evaluation

## 2018-04-29 NOTE — Op Note (Signed)
[facility] Patient Name: Karen Byrd Procedure Date: 04/29/2018 8:12 AM MRN: [id number] Account #: [id number] Date of Birth: 1981-07-02 Admit Type: Outpatient Age: 37 Room: [facility] ENDO ROOM 2 Gender: Female Note Status: Finalized Procedure:            Colonoscopy Indications:          Incidental change in bowel habits noted Providers:            Benay Pike. Alice Reichert MD, MD Referring MD:         Jearld Fenton (Referring MD) Medicines:            Propofol per Anesthesia Complications:        No immediate complications. Procedure:            Pre-Anesthesia Assessment:                       - The risks and benefits of the procedure and the                        sedation options and risks were discussed with the                        patient. All questions were answered and informed                        consent was obtained.                       - Patient identification and proposed procedure were                        verified prior to the procedure by the nurse. The                        procedure was verified in the procedure room.                       - ASA Grade Assessment: III - A patient with severe                        systemic disease.                       - After reviewing the risks and benefits, the patient                        was deemed in satisfactory condition to undergo the                        procedure.                       After obtaining informed consent, the colonoscope was                        passed under direct vision. Throughout the procedure,                        the patient's blood pressure, pulse, and oxygen  saturations were monitored continuously. The                        Colonoscope was introduced through the anus and                        advanced to the the cecum, identified by appendiceal                        orifice and ileocecal valve. The colonoscopy was           performed without difficulty. The patient tolerated the                        procedure well. The quality of the bowel preparation                        was good. The terminal ileum, ileocecal valve,                        appendiceal orifice, and rectum were photographed. Findings:      The perianal and digital rectal examinations were normal. Pertinent       negatives include normal sphincter tone.      The perianal exam findings include non-thrombosed external hemorrhoids       and internal hemorrhoids (Grade I).      The digital rectal exam was normal. Pertinent negatives include normal       sphincter tone.      The colon (entire examined portion) appeared normal.      Non-bleeding internal hemorrhoids were found during retroflexion. The       hemorrhoids were Grade I (internal hemorrhoids that do not prolapse).      The terminal ileum appeared normal.      The exam was otherwise without abnormality. Impression:           - Non-thrombosed external hemorrhoids and internal                        hemorrhoids (Grade I) found on perianal exam.                       - The entire examined colon is normal.                       - Non-bleeding internal hemorrhoids.                       - The examined portion of the ileum was normal.                       - The examination was otherwise normal.                       - No specimens collected. Recommendation:       - Patient has a contact number available for                        emergencies. The signs and symptoms of potential                        delayed complications were discussed with the patient.  Return to normal activities tomorrow. Written discharge                        instructions were provided to the patient.                       - Resume previous diet.                       - Continue present medications.                       - Await pathology results from EGD, also performed                         today.                       - Repeat colonoscopy at age 33 for screening purposes.                       - Return to nurse practitioner in 6 weeks.                       - Use Linzess (linaclotide) 290 mcg PO daily.                       - The findings and recommendations were discussed with                        the patient and their family. Procedure Code(s):    --- Professional ---                       [phone], Colonoscopy, flexible; diagnostic, including                        collection of specimen(s) by brushing or washing, when                        performed (separate procedure) Diagnosis Code(s):    --- Professional ---                       K64.4, Residual hemorrhoidal skin tags                       K64.0, First degree hemorrhoids CPT copyright 2017 American Medical Association. All rights reserved. The codes documented in this report are preliminary and upon coder review may  be revised to meet current compliance requirements. Efrain Sella MD, MD 04/29/2018 8:48:19 AM This report has been signed electronically. Number of Addenda: 0 Note Initiated On: 04/29/2018 8:12 AM Scope Withdrawal Time: 0 hours 4 minutes 13 seconds  Total Procedure Duration: 0 hours 10 minutes 3 seconds       [facility]

## 2018-04-29 NOTE — Op Note (Signed)
[facility] Patient Name: Karen Byrd Procedure Date: 04/29/2018 8:13 AM MRN: [id number] Account #: [id number] Date of Birth: December 11, 1980 Admit Type: Outpatient Age: 37 Room: [facility] ENDO ROOM 2 Gender: Female Note Status: Finalized Procedure:            Upper GI endoscopy Indications:          Dysphagia, Suspected esophageal reflux Providers:            Benay Pike. Alice Reichert MD, MD Referring MD:         Jearld Fenton (Referring MD) Medicines:            Propofol per Anesthesia Complications:        No immediate complications. Procedure:            Pre-Anesthesia Assessment:                       - The risks and benefits of the procedure and the                        sedation options and risks were discussed with the                        patient. All questions were answered and informed                        consent was obtained.                       - Patient identification and proposed procedure were                        verified prior to the procedure by the nurse. The                        procedure was verified in the procedure room.                       - ASA Grade Assessment: III - A patient with severe                        systemic disease.                       - After reviewing the risks and benefits, the patient                        was deemed in satisfactory condition to undergo the                        procedure.                       After obtaining informed consent, the endoscope was                        passed under direct vision. Throughout the procedure,                        the patient's blood pressure, pulse, and oxygen  saturations were monitored continuously. The Endoscope                        was introduced through the mouth, and advanced to the                        third part of duodenum. The upper GI endoscopy was                        accomplished without difficulty. The patient  tolerated                        the procedure well. Findings:      Diffuse moderate mucosal changes characterized by erythema and       longitudinal markings were found in the entire esophagus. Biopsies were       obtained from the proximal and distal esophagus with cold forceps for       histology of suspected eosinophilic esophagitis.      The Z-line was irregular and was found at the lower esophageal       sphincter. Mucosa was biopsied with a cold forceps for histology. One       specimen bottle was sent to pathology.      Patchy mildly erythematous mucosa without bleeding was found in the       gastric antrum. Biopsies were taken with a cold forceps for Helicobacter       pylori testing.      The cardia and gastric fundus were normal on retroflexion.      Localized mildly erythematous mucosa without active bleeding and with no       stigmata of bleeding was found in the first portion of the duodenum.      The exam was otherwise without abnormality. Impression:           - Erythematous, longitudinally marked mucosa in the                        esophagus. Biopsied.                       - Z-line irregular, at the lower esophageal sphincter.                        Biopsied.                       - Erythematous mucosa in the antrum. Biopsied.                       - Erythematous duodenopathy.                       - The examination was otherwise normal. Recommendation:       - Await pathology results.                       - Proceed with colonoscopy Procedure Code(s):    --- Professional ---                       [phone], Esophagogastroduodenoscopy, flexible, transoral;  with biopsy, single or multiple Diagnosis Code(s):    --- Professional ---                       R13.10, Dysphagia, unspecified                       K31.89, Other diseases of stomach and duodenum                       K22.8, Other specified diseases of esophagus CPT copyright 2017 American  Medical Association. All rights reserved. The codes documented in this report are preliminary and upon coder review may  be revised to meet current compliance requirements. Efrain Sella MD, MD 04/29/2018 8:32:03 AM This report has been signed electronically. Number of Addenda: 0 Note Initiated On: 04/29/2018 8:13 AM Total Procedure Duration: 0 hours 6 minutes 7 seconds       [facility]

## 2018-04-29 NOTE — Anesthesia Post-op Follow-up Note (Signed)
Anesthesia QCDR form completed.        

## 2018-04-30 ENCOUNTER — Encounter: Payer: Self-pay | Admitting: Internal Medicine

## 2018-04-30 LAB — SURGICAL PATHOLOGY

## 2018-05-14 DIAGNOSIS — K219 Gastro-esophageal reflux disease without esophagitis: Secondary | ICD-10-CM | POA: Diagnosis not present

## 2018-05-14 DIAGNOSIS — K5909 Other constipation: Secondary | ICD-10-CM | POA: Diagnosis not present

## 2018-05-22 ENCOUNTER — Ambulatory Visit: Payer: 59 | Admitting: Internal Medicine

## 2018-05-22 ENCOUNTER — Encounter: Payer: Self-pay | Admitting: Internal Medicine

## 2018-05-22 VITALS — BP 108/74 | HR 71 | Temp 97.9°F | Wt 190.0 lb

## 2018-05-22 DIAGNOSIS — R059 Cough, unspecified: Secondary | ICD-10-CM

## 2018-05-22 DIAGNOSIS — R0982 Postnasal drip: Secondary | ICD-10-CM

## 2018-05-22 DIAGNOSIS — R05 Cough: Secondary | ICD-10-CM | POA: Diagnosis not present

## 2018-05-22 NOTE — Patient Instructions (Signed)

## 2018-05-22 NOTE — Progress Notes (Signed)
HPI  Pt presents to the clinic today with c/o cough. Shee reports this started 4 days ago. The cough is productive of clear mucous. She has felt a little fatigued with a poor appetite but denies nausea, vomiting or diarrhea. She feels like the poor appetite is related to recent upper GI and colonoscopy. She denies runny nose, nasal congestion, ear pain, sore throat or shortness of breath. She has tried Copywriter, advertising and Zyrtec with some relief. She has some seasonal allergies. She has not had sick contacts.  Review of Systems      Past Medical History:  Diagnosis Date  . Constipation   . Depression   . Diabetes mellitus   . Diabetes mellitus without complication ([place])   . Dysphagia   . VSD (ventricular septal defect)     Family History  Problem Relation Age of Onset  . Hypertension Mother   . Diabetes Mother   . Hyperlipidemia Father   . Hypertension Father   . Heart disease Maternal Grandfather   . Diabetes Maternal Grandfather   . Stroke Maternal Grandfather   . Hyperlipidemia Paternal Grandmother   . Heart disease Paternal Grandmother   . Hypertension Paternal Grandmother   . Diabetes Paternal Grandmother   . Stroke Paternal Grandmother   . Hyperlipidemia Paternal Grandfather   . Hypertension Paternal Grandfather   . Stroke Paternal Grandfather   . Cancer Maternal Grandmother        leukemia  . Anesthesia problems Neg Hx   . Hypotension Neg Hx   . Malignant hyperthermia Neg Hx   . Pseudochol deficiency Neg Hx     Social History   Socioeconomic History  . Marital status: Married    Spouse name: Not on file  . Number of children: Not on file  . Years of education: Not on file  . Highest education level: Not on file  Occupational History  . Not on file  Social Needs  . Financial resource strain: Not on file  . Food insecurity:    Worry: Not on file    Inability: Not on file  . Transportation needs:    Medical: Not on file    Non-medical: Not on file   Tobacco Use  . Smoking status: Never Smoker  . Smokeless tobacco: Never Used  Substance and Sexual Activity  . Alcohol use: Yes    Comment: occasional none last 24hrs  . Drug use: No  . Sexual activity: Yes    Birth control/protection: IUD  Lifestyle  . Physical activity:    Days per week: Not on file    Minutes per session: Not on file  . Stress: Not on file  Relationships  . Social connections:    Talks on phone: Not on file    Gets together: Not on file    Attends religious service: Not on file    Active member of club or organization: Not on file    Attends meetings of clubs or organizations: Not on file    Relationship status: Not on file  . Intimate partner violence:    Fear of current or ex partner: Not on file    Emotionally abused: Not on file    Physically abused: Not on file    Forced sexual activity: Not on file  Other Topics Concern  . Not on file  Social History Narrative   ** Merged History Encounter **        Allergies  Allergen Reactions  . Sulfa Antibiotics Hives    "  massive hives"     Constitutional: Positive fatigue. Denies headache, fever or abrupt weight changes.  HEENT:  Denies eye redness, eye pain, pressure behind the eyes, facial pain, nasal congestion, ear pain, ringing in the ears, wax buildup, runny nose or sore throat. Respiratory: Positive cough. Denies difficulty breathing or shortness of breath.  Cardiovascular: Denies chest pain, chest tightness, palpitations or swelling in the hands or feet.   No other specific complaints in a complete review of systems (except as listed in HPI above).  Objective:   BP 108/74   Pulse 71   Temp 97.9 F (36.6 C) (Oral)   Wt 190 lb (86.2 kg)   SpO2 98%   BMI 26.50 kg/m  Wt Readings from Last 3 Encounters:  05/22/18 190 lb (86.2 kg)  04/29/18 195 lb (88.5 kg)  03/25/18 199 lb (90.3 kg)     General: Appears her stated age, well developed, well nourished in NAD. HEENT: Head: normal shape  and size, no sinus tenderness noted; Ears: Tm's gray and intact, normal light reflex;  Throat/Mouth: + PND. Teeth present, mucosa pink and moist, no exudate noted, no lesions or ulcerations noted.  Neck: No cervical lymphadenopathy.  Cardiovascular: Normal rate and rhythm.   Pulmonary/Chest: Normal effort and positive vesicular breath sounds. No respiratory distress. No wheezes, rales or ronchi noted.       Assessment & Plan:   Cough secondary to PND:  Likely allergies No indication for chest xray or abx Avoid steroids d/t hx of DM 1 Continue Zyrtec in pm Add Claritin in am Delsym or cough drops (sugar free) as needed for cough  RTC as needed or if symptoms persist.   Webb Silversmith, NP

## 2018-05-27 DIAGNOSIS — E1069 Type 1 diabetes mellitus with other specified complication: Secondary | ICD-10-CM | POA: Diagnosis not present

## 2018-05-27 DIAGNOSIS — E103293 Type 1 diabetes mellitus with mild nonproliferative diabetic retinopathy without macular edema, bilateral: Secondary | ICD-10-CM | POA: Diagnosis not present

## 2018-05-27 DIAGNOSIS — E11649 Type 2 diabetes mellitus with hypoglycemia without coma: Secondary | ICD-10-CM | POA: Diagnosis not present

## 2018-06-18 DIAGNOSIS — E1065 Type 1 diabetes mellitus with hyperglycemia: Secondary | ICD-10-CM | POA: Diagnosis not present

## 2018-06-18 DIAGNOSIS — E103293 Type 1 diabetes mellitus with mild nonproliferative diabetic retinopathy without macular edema, bilateral: Secondary | ICD-10-CM | POA: Diagnosis not present

## 2018-08-01 ENCOUNTER — Other Ambulatory Visit: Payer: Self-pay | Admitting: Internal Medicine

## 2018-08-26 DIAGNOSIS — E785 Hyperlipidemia, unspecified: Secondary | ICD-10-CM | POA: Diagnosis not present

## 2018-08-26 DIAGNOSIS — E1069 Type 1 diabetes mellitus with other specified complication: Secondary | ICD-10-CM | POA: Diagnosis not present

## 2018-08-26 DIAGNOSIS — E103293 Type 1 diabetes mellitus with mild nonproliferative diabetic retinopathy without macular edema, bilateral: Secondary | ICD-10-CM | POA: Diagnosis not present

## 2018-09-09 ENCOUNTER — Telehealth: Payer: Self-pay

## 2018-09-09 NOTE — Telephone Encounter (Signed)
[employer] called to say the pt has a PA renewal request for [employer]. Key is [id number]. Michela Pitcher it can be completed online.

## 2018-09-11 DIAGNOSIS — E109 Type 1 diabetes mellitus without complications: Secondary | ICD-10-CM | POA: Diagnosis not present

## 2018-09-12 NOTE — Telephone Encounter (Signed)
[employer] contacted office back; requesting status of PA. States pt has been inquiring with pharmacy. pls advise

## 2018-09-12 NOTE — Telephone Encounter (Addendum)
If med list was reviewed, it is shown Karen Byrd does not prescribe the insulin and has not in Karen Byrd, pt sees Karen Byrd and that is why I have not received PA... Pt has only been seen for an acute in the past year... I will have to check with Karen Byrd if she is okay with filling Rx as she is not managing DM as mentioned in last labs from 08/2017, pt was told to f/u with Endocrinology

## 2018-09-15 NOTE — Telephone Encounter (Signed)
Called pharmacy and they stated that it already has been taken care of problem resiolved

## 2018-09-16 DIAGNOSIS — E1065 Type 1 diabetes mellitus with hyperglycemia: Secondary | ICD-10-CM | POA: Diagnosis not present

## 2018-09-16 DIAGNOSIS — E103293 Type 1 diabetes mellitus with mild nonproliferative diabetic retinopathy without macular edema, bilateral: Secondary | ICD-10-CM | POA: Diagnosis not present

## 2018-10-17 ENCOUNTER — Emergency Department: Payer: 59

## 2018-10-17 ENCOUNTER — Encounter: Payer: Self-pay | Admitting: Emergency Medicine

## 2018-10-17 ENCOUNTER — Emergency Department
Admission: EM | Admit: 2018-10-17 | Discharge: 2018-10-17 | Disposition: A | Discharge status: Home / Self Care | Payer: 59 | Attending: Emergency Medicine | Admitting: Emergency Medicine

## 2018-10-17 ENCOUNTER — Other Ambulatory Visit: Payer: Self-pay

## 2018-10-17 DIAGNOSIS — S20219A Contusion of unspecified front wall of thorax, initial encounter: Secondary | ICD-10-CM | POA: Diagnosis not present

## 2018-10-17 DIAGNOSIS — S60221A Contusion of right hand, initial encounter: Secondary | ICD-10-CM

## 2018-10-17 DIAGNOSIS — S161XXA Strain of muscle, fascia and tendon at neck level, initial encounter: Secondary | ICD-10-CM | POA: Insufficient documentation

## 2018-10-17 DIAGNOSIS — Y998 Other external cause status: Secondary | ICD-10-CM | POA: Diagnosis not present

## 2018-10-17 DIAGNOSIS — E119 Type 2 diabetes mellitus without complications: Secondary | ICD-10-CM | POA: Insufficient documentation

## 2018-10-17 DIAGNOSIS — Y93I9 Activity, other involving external motion: Secondary | ICD-10-CM | POA: Diagnosis not present

## 2018-10-17 DIAGNOSIS — Z79899 Other long term (current) drug therapy: Secondary | ICD-10-CM | POA: Insufficient documentation

## 2018-10-17 DIAGNOSIS — S50812A Abrasion of left forearm, initial encounter: Secondary | ICD-10-CM | POA: Diagnosis not present

## 2018-10-17 DIAGNOSIS — S199XXA Unspecified injury of neck, initial encounter: Secondary | ICD-10-CM | POA: Diagnosis present

## 2018-10-17 DIAGNOSIS — Y92410 Unspecified street and highway as the place of occurrence of the external cause: Secondary | ICD-10-CM | POA: Insufficient documentation

## 2018-10-17 DIAGNOSIS — Z23 Encounter for immunization: Secondary | ICD-10-CM | POA: Insufficient documentation

## 2018-10-17 DIAGNOSIS — T07XXXA Unspecified multiple injuries, initial encounter: Secondary | ICD-10-CM

## 2018-10-17 MED ORDER — NAPROXEN 500 MG PO TABS: 500.0000 mg | ORAL_TABLET | Freq: Two times a day (BID) | ORAL | 0 refills | Status: DC

## 2018-10-17 MED ORDER — METHOCARBAMOL 500 MG PO TABS: 500.0000 mg | ORAL_TABLET | Freq: Four times a day (QID) | ORAL | 0 refills | Status: DC | PRN

## 2018-10-17 MED ORDER — TETANUS-DIPHTH-ACELL PERTUSSIS 5-2.5-18.5 LF-MCG/0.5 IM SUSP
0.5000 mL | Freq: Once | INTRAMUSCULAR | Status: AC
  Administered 2018-10-17: 0.5 mL via INTRAMUSCULAR
  Filled 2018-10-17: qty 0.5
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
## 2018-10-17 NOTE — Discharge Instructions (Addendum)
Follow-up with your primary care provider if any continued problems.  Ice and elevation for your right hand if needed for swelling and to reduce pain.  You may use ice or heat to your muscles as needed for disc comfort.  Begin taking naproxen 500 mg twice daily with food.  Robaxin is the muscle relaxant.  If you are at home you may take it every 6 hours as needed for muscle spasms and discomfort.  If you are driving or operating machinery it may be that she can only take it before going to bed at night.  You can expect to be sore for approximately 4 to 5 days.
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
## 2018-10-29 ENCOUNTER — Other Ambulatory Visit: Payer: Self-pay | Admitting: Internal Medicine

## 2018-10-29 NOTE — Telephone Encounter (Signed)
Last f/u appt 09/08/2018. Last Rx 08/01/2018 #90. pls advise

## 2018-11-05 ENCOUNTER — Ambulatory Visit: Payer: Self-pay | Admitting: Internal Medicine

## 2018-11-06 ENCOUNTER — Encounter: Payer: Self-pay | Admitting: Family Medicine

## 2018-11-06 ENCOUNTER — Ambulatory Visit: Payer: 59 | Admitting: Family Medicine

## 2018-11-06 ENCOUNTER — Ambulatory Visit (INDEPENDENT_AMBULATORY_CARE_PROVIDER_SITE_OTHER)
Admission: RE | Admit: 2018-11-06 | Discharge: 2018-11-06 | Disposition: A | Discharge status: Home / Self Care | Payer: 59 | Source: Ambulatory Visit | Attending: Family Medicine | Admitting: Family Medicine

## 2018-11-06 VITALS — BP 110/66 | HR 84 | Temp 98.0°F | Ht 71.0 in | Wt 205.2 lb

## 2018-11-06 DIAGNOSIS — M79644 Pain in right finger(s): Secondary | ICD-10-CM | POA: Diagnosis not present

## 2018-11-06 DIAGNOSIS — S63656A Sprain of metacarpophalangeal joint of right little finger, initial encounter: Secondary | ICD-10-CM | POA: Insufficient documentation

## 2018-11-06 DIAGNOSIS — S63656D Sprain of metacarpophalangeal joint of right little finger, subsequent encounter: Secondary | ICD-10-CM | POA: Diagnosis not present

## 2018-11-06 NOTE — Assessment & Plan Note (Signed)
Rest, can splint if being active. ROM exercises. Return if not improving in next 3-4 weeks
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
## 2018-11-06 NOTE — Patient Instructions (Signed)
You likely have a finger sprain.   You can try to "buddy tape" your fingers for a few days, and then when you are being very active.   Do range of motion exercises every day to help avoid stiffness -- making a fist and relaxing -- spreading your fingers apart and bring them together

## 2018-12-09 DIAGNOSIS — E785 Hyperlipidemia, unspecified: Secondary | ICD-10-CM | POA: Diagnosis not present

## 2018-12-09 DIAGNOSIS — E103293 Type 1 diabetes mellitus with mild nonproliferative diabetic retinopathy without macular edema, bilateral: Secondary | ICD-10-CM | POA: Diagnosis not present

## 2018-12-09 DIAGNOSIS — E1069 Type 1 diabetes mellitus with other specified complication: Secondary | ICD-10-CM | POA: Diagnosis not present

## 2018-12-15 DIAGNOSIS — E103293 Type 1 diabetes mellitus with mild nonproliferative diabetic retinopathy without macular edema, bilateral: Secondary | ICD-10-CM | POA: Diagnosis not present

## 2018-12-15 DIAGNOSIS — E1065 Type 1 diabetes mellitus with hyperglycemia: Secondary | ICD-10-CM | POA: Diagnosis not present

## 2019-01-02 DIAGNOSIS — J018 Other acute sinusitis: Secondary | ICD-10-CM | POA: Diagnosis not present

## 2019-01-02 DIAGNOSIS — E109 Type 1 diabetes mellitus without complications: Secondary | ICD-10-CM | POA: Diagnosis not present

## 2019-01-03 ENCOUNTER — Ambulatory Visit: Payer: Self-pay | Admitting: Family Medicine

## 2019-02-03 ENCOUNTER — Telehealth: Payer: Self-pay

## 2019-02-03 ENCOUNTER — Other Ambulatory Visit: Payer: Self-pay | Admitting: Internal Medicine

## 2019-02-03 NOTE — Telephone Encounter (Signed)
Pt returned call. Please call back.

## 2019-02-03 NOTE — Telephone Encounter (Signed)
Received refill request for pt and she has not been seen in over 2 years. Pt needs to set up video virtual visit for refills.

## 2019-02-06 ENCOUNTER — Ambulatory Visit (INDEPENDENT_AMBULATORY_CARE_PROVIDER_SITE_OTHER): Payer: 59 | Admitting: Internal Medicine

## 2019-02-06 ENCOUNTER — Encounter: Payer: Self-pay | Admitting: Internal Medicine

## 2019-02-06 DIAGNOSIS — R2232 Localized swelling, mass and lump, left upper limb: Secondary | ICD-10-CM | POA: Diagnosis not present

## 2019-02-06 DIAGNOSIS — E109 Type 1 diabetes mellitus without complications: Secondary | ICD-10-CM

## 2019-02-06 DIAGNOSIS — K581 Irritable bowel syndrome with constipation: Secondary | ICD-10-CM

## 2019-02-06 NOTE — Progress Notes (Signed)
Virtual Visit via Video Note  I connected with Karen Byrd on 02/06/19 at  3:45 PM EDT by a video enabled telemedicine application and verified that I am speaking with the correct person using two identifiers.   I discussed the limitations of evaluation and management by telemedicine and the availability of in person appointments. The patient expressed understanding and agreed to proceed.  Patient Location: In her car at work Provider Location: Office History of Present Illness:  Pt due for follow up of chronic conditoins:  DM 1: Her last A1C was 8.3%, 10/2018. Her sugars range 57-400. She is taking Novolog as prescribed. She checks her feet daily. Her last eye exam was 10/2018. Flu never. Pneumovax never. She does with endocrinology.  GERD: She denies breakthrough on Pantoprazole. Upper GI from 04/2018 reviewed.  Chronic Constipation: Controlled with Fiber and Linzess. Colonoscopy from 04/2018 reviewed.  Anxiety and Depression: Persistent, slightly worse with the pandemic. She is taking Sertraline as prescribed. She is not seeing a therapist.  Of note, she reports the mass of her left forearm itches and is getting bigger. She is not quit ready for referral to general surgeon at this time.  Observations/Objective:  Alert and oriented x 3 NAD Unkempt but behavior, judgement and thought content normal   Assessment and Plan:  Cyst of Left Forearm:  Continue to monitor She will let me know when she is ready to see general surgery  See problem based charting  Follow Up Instructions:    I discussed the assessment and treatment plan with the patient. The patient was provided an opportunity to ask questions and all were answered. The patient agreed with the plan and demonstrated an understanding of the instructions.   The patient was advised to call back or seek an in-person evaluation if the symptoms worsen or if the condition fails to improve as anticipated.    Webb Silversmith,  NP

## 2019-02-06 NOTE — Assessment & Plan Note (Signed)
A1C abstracted in chart Will abstract microalbumin Continue Novolog Encouraged her to consume a low carb diet and continue exercise Will request eye exam form [facility] Encourage daily foot exam She declines flu and pneumovax

## 2019-02-06 NOTE — Assessment & Plan Note (Signed)
Continue high fiber diet and Linzess Will monitor

## 2019-02-06 NOTE — Patient Instructions (Signed)

## 2019-02-16 DIAGNOSIS — L03818 Cellulitis of other sites: Secondary | ICD-10-CM | POA: Diagnosis not present

## 2019-02-18 DIAGNOSIS — L02214 Cutaneous abscess of groin: Secondary | ICD-10-CM | POA: Diagnosis not present

## 2019-02-25 ENCOUNTER — Encounter: Payer: Self-pay | Admitting: Internal Medicine

## 2019-02-26 ENCOUNTER — Encounter: Payer: Self-pay | Admitting: Internal Medicine

## 2019-02-26 ENCOUNTER — Other Ambulatory Visit: Payer: Self-pay

## 2019-02-26 ENCOUNTER — Ambulatory Visit: Payer: 59 | Admitting: Internal Medicine

## 2019-02-26 VITALS — BP 114/72 | HR 70 | Temp 98.0°F | Wt 215.0 lb

## 2019-02-26 DIAGNOSIS — M26609 Unspecified temporomandibular joint disorder, unspecified side: Secondary | ICD-10-CM

## 2019-02-26 MED ORDER — PREDNISONE 10 MG PO TABS: ORAL_TABLET | ORAL | 0 refills | Status: DC

## 2019-02-26 NOTE — Patient Instructions (Signed)

## 2019-02-26 NOTE — Progress Notes (Signed)
Subjective:    Patient ID: Karen Byrd, female    DOB: 1981/02/19, 38 y.o.   MRN: [id number]  HPI  Pt presents to the clinic today with c/o right ear pain and right side jaw pain. She reports this started 1 week ago. She describes the pain as achy and sore. She denies drainage from the ear or loss of hearing. She describes the jaw pain as tightness, throbbing. It is painful with talking or when she opens her mouth to chew. She has not had any popping. Her jaw has not gotten stuck open. She denies pain in her teeth. She denies any injury to the area. She has taking Ibuprofen as as needed without any relief.  Review of Systems      Past Medical History:  Diagnosis Date  . Constipation   . Depression   . Diabetes mellitus   . Diabetes mellitus without complication ([place])   . Dysphagia   . VSD (ventricular septal defect)     Current Outpatient Medications  Medication Sig Dispense Refill  . ACZONE 7.5 % GEL APPLY TO THE AFFECTED AREA EVERY DAY  3  . insulin lispro (HUMALOG) 100 UNIT/ML injection Use up to 70 units daily as directed via insulin pump    . linaclotide (LINZESS) 290 MCG CAPS capsule TAKE 1 CAPSULE (290 MCG TOTAL) BY MOUTH ONCE DAILY    . pantoprazole (PROTONIX) 40 MG tablet Take 1 tablet by mouth 2 (two) times daily.    Marland Kitchen PARAGARD INTRAUTERINE COPPER IU 1 Device by Intrauterine route. Every 10 years--inserted 2014    . sertraline (ZOLOFT) 50 MG tablet TAKE 1 TABLET (50 MG TOTAL) BY MOUTH DAILY. MUST SCHEDULE ANNUAL EXAM 90 tablet 0   No current facility-administered medications for this visit.     Allergies  Allergen Reactions  . Sulfa Antibiotics Hives    "massive hives"    Family History  Problem Relation Age of Onset  . Hypertension Mother   . Diabetes Mother   . Hyperlipidemia Father   . Hypertension Father   . Heart disease Maternal Grandfather   . Diabetes Maternal Grandfather   . Stroke Maternal Grandfather   . Hyperlipidemia Paternal Grandmother    . Heart disease Paternal Grandmother   . Hypertension Paternal Grandmother   . Diabetes Paternal Grandmother   . Stroke Paternal Grandmother   . Hyperlipidemia Paternal Grandfather   . Hypertension Paternal Grandfather   . Stroke Paternal Grandfather   . Cancer Maternal Grandmother        leukemia  . Anesthesia problems Neg Hx   . Hypotension Neg Hx   . Malignant hyperthermia Neg Hx   . Pseudochol deficiency Neg Hx     Social History   Socioeconomic History  . Marital status: Married    Spouse name: Not on file  . Number of children: Not on file  . Years of education: Not on file  . Highest education level: Not on file  Occupational History  . Not on file  Social Needs  . Financial resource strain: Not on file  . Food insecurity:    Worry: Not on file    Inability: Not on file  . Transportation needs:    Medical: Not on file    Non-medical: Not on file  Tobacco Use  . Smoking status: Never Smoker  . Smokeless tobacco: Never Used  Substance and Sexual Activity  . Alcohol use: Yes    Comment: occasional none last 24hrs  . Drug use:  No  . Sexual activity: Yes    Birth control/protection: I.U.D.  Lifestyle  . Physical activity:    Days per week: Not on file    Minutes per session: Not on file  . Stress: Not on file  Relationships  . Social connections:    Talks on phone: Not on file    Gets together: Not on file    Attends religious service: Not on file    Active member of club or organization: Not on file    Attends meetings of clubs or organizations: Not on file    Relationship status: Not on file  . Intimate partner violence:    Fear of current or ex partner: Not on file    Emotionally abused: Not on file    Physically abused: Not on file    Forced sexual activity: Not on file  Other Topics Concern  . Not on file  Social History Narrative   ** Merged History Encounter **         Constitutional: Denies fever, malaise, fatigue, headache or abrupt  weight changes.  HEENT: Pt reports right ear pain. Denies eye pain, eye redness, ringing in the ears, wax buildup, runny nose, nasal congestion, bloody nose, or sore throat. Respiratory: Denies difficulty breathing, shortness of breath, cough or sputum production.   Cardiovascular: Denies chest pain, chest tightness, palpitations or swelling in the hands or feet.  Musculoskeletal: Pt reports right sided jaw pain. Denies decrease in range of motion, difficulty with gait, muscle pain or joint swelling.   No other specific complaints in a complete review of systems (except as listed in HPI above).  Objective:   Physical Exam    BP 114/72   Pulse 70   Temp 98 F (36.7 C) (Oral)   Wt 215 lb (97.5 kg)   SpO2 98%   BMI 29.99 kg/m   Wt Readings from Last 3 Encounters:  11/06/18 205 lb 4 oz (93.1 kg)  10/17/18 185 lb (83.9 kg)  05/22/18 190 lb (86.2 kg)    General: Appears her stated age, obese in NAD. HEENT: Head: normal shape and size; Ears: Tm's gray and intact, normal light reflex; Throat/Mouth: Teeth present, mucosa pink and moist, no exudate, lesions or ulcerations noted.  Neck:  No adenopathy noted. Cardiovascular: Normal rate and rhythm. S1,S2 noted.  No murmur, rubs or gallops noted.  Pulmonary/Chest: Normal effort and positive vesicular breath sounds. No respiratory distress. No wheezes, rales or ronchi noted.  Musculoskeletal: Pain with palpation over the right TMJ. She has limited extension of the jaw. Neurological: Alert and oriented.   BMET    Component Value Date/Time   NA 138 09/05/2017 0918   K 3.9 09/05/2017 0918   CL 101 09/05/2017 0918   CO2 29 09/05/2017 0918   GLUCOSE 135 (H) 09/05/2017 0918   BUN 9 09/05/2017 0918   CREATININE 0.61 09/05/2017 0918   CALCIUM 9.5 09/05/2017 0918   GFRNONAA >60 03/20/2016 1100   GFRAA >60 03/20/2016 1100    Lipid Panel     Component Value Date/Time   CHOL 229 (H) 09/05/2017 0918   TRIG 61.0 09/05/2017 0918   HDL  74.20 09/05/2017 0918   CHOLHDL 3 09/05/2017 0918   VLDL 12.2 09/05/2017 0918   LDLCALC 142 (H) 09/05/2017 0918    CBC    Component Value Date/Time   WBC 6.1 09/05/2017 0918   RBC 4.38 09/05/2017 0918   HGB 13.7 09/05/2017 0918   HCT 41.4 09/05/2017 0918  PLT 250.0 09/05/2017 0918   MCV 94.4 09/05/2017 0918   MCH 30.3 03/20/2016 1100   MCHC 33.1 09/05/2017 0918   RDW 12.9 09/05/2017 0918    Hgb A1C Lab Results  Component Value Date   HGBA1C 9.6 (H) 09/05/2017          Assessment & Plan:   TMJ Dysfunction, Right:  Will put her on Pred Taper x 6 days, as OTC NSAID's have not been effective Advised her to monitor her sugars Discussed that grinding teeth at night/stress can worsen TMJ If persist, she will follow up with dentist for panaramic xray of mandible  Return precautions discussed Webb Silversmith, NP

## 2019-03-03 DIAGNOSIS — E109 Type 1 diabetes mellitus without complications: Secondary | ICD-10-CM | POA: Diagnosis not present

## 2019-03-03 DIAGNOSIS — B353 Tinea pedis: Secondary | ICD-10-CM | POA: Diagnosis not present

## 2019-03-17 DIAGNOSIS — E1065 Type 1 diabetes mellitus with hyperglycemia: Secondary | ICD-10-CM | POA: Diagnosis not present

## 2019-03-17 DIAGNOSIS — E103293 Type 1 diabetes mellitus with mild nonproliferative diabetic retinopathy without macular edema, bilateral: Secondary | ICD-10-CM | POA: Diagnosis not present

## 2019-08-03 ENCOUNTER — Ambulatory Visit: Payer: 59 | Admitting: Internal Medicine

## 2019-08-03 ENCOUNTER — Other Ambulatory Visit: Payer: Self-pay

## 2019-08-03 ENCOUNTER — Encounter: Payer: Self-pay | Admitting: Internal Medicine

## 2019-08-03 VITALS — BP 116/78 | HR 86 | Temp 98.3°F | Wt 214.0 lb

## 2019-08-03 DIAGNOSIS — E109 Type 1 diabetes mellitus without complications: Secondary | ICD-10-CM | POA: Insufficient documentation

## 2019-08-03 DIAGNOSIS — D239 Other benign neoplasm of skin, unspecified: Secondary | ICD-10-CM | POA: Diagnosis not present

## 2019-08-03 DIAGNOSIS — E1065 Type 1 diabetes mellitus with hyperglycemia: Secondary | ICD-10-CM | POA: Insufficient documentation

## 2019-08-03 DIAGNOSIS — Z23 Encounter for immunization: Secondary | ICD-10-CM | POA: Diagnosis not present

## 2019-08-03 DIAGNOSIS — D367 Benign neoplasm of other specified sites: Secondary | ICD-10-CM | POA: Diagnosis not present

## 2019-08-03 MED ORDER — ACZONE 7.5 % EX GEL: 1.0000 "application " | Freq: Every day | CUTANEOUS | 3 refills | Status: DC

## 2019-08-03 NOTE — Progress Notes (Signed)
Subjective:    Patient ID: Karen Byrd, female    DOB: 13-Mar-1981, 38 y.o.   MRN: [id number]  HPI  Pt reports mass of left arm. She also reports similar lesion on her right leg. These masses have been  There> 6 months. She reports the one on her left arm has gotten bigger in size, and is itchy. She denies redness, or drainage from the areas. She has not tried anything OTC for this, but has seen a dermatologist in the past and would like a referral to dermatologist (her'r retired).  Review of Systems      Past Medical History:  Diagnosis Date  . Constipation   . Depression   . Diabetes mellitus   . Diabetes mellitus without complication ([place])   . Dysphagia   . VSD (ventricular septal defect)     Current Outpatient Medications  Medication Sig Dispense Refill  . ACZONE 7.5 % GEL APPLY TO THE AFFECTED AREA EVERY DAY  3  . insulin lispro (HUMALOG) 100 UNIT/ML injection Use up to 70 units daily as directed via insulin pump    . linaclotide (LINZESS) 290 MCG CAPS capsule TAKE 1 CAPSULE (290 MCG TOTAL) BY MOUTH ONCE DAILY    . pantoprazole (PROTONIX) 40 MG tablet Take 1 tablet by mouth 2 (two) times daily.    Marland Kitchen PARAGARD INTRAUTERINE COPPER IU 1 Device by Intrauterine route. Every 10 years--inserted 2014    . predniSONE (DELTASONE) 10 MG tablet Take 3 tabs on days 1-2, take 2 tabs on days 3-4, take 1 tab on days 5-6 12 tablet 0  . sertraline (ZOLOFT) 50 MG tablet TAKE 1 TABLET (50 MG TOTAL) BY MOUTH DAILY. MUST SCHEDULE ANNUAL EXAM 90 tablet 0   No current facility-administered medications for this visit.     Allergies  Allergen Reactions  . Sulfa Antibiotics Hives    "massive hives"    Family History  Problem Relation Age of Onset  . Hypertension Mother   . Diabetes Mother   . Hyperlipidemia Father   . Hypertension Father   . Heart disease Maternal Grandfather   . Diabetes Maternal Grandfather   . Stroke Maternal Grandfather   . Hyperlipidemia Paternal Grandmother   .  Heart disease Paternal Grandmother   . Hypertension Paternal Grandmother   . Diabetes Paternal Grandmother   . Stroke Paternal Grandmother   . Hyperlipidemia Paternal Grandfather   . Hypertension Paternal Grandfather   . Stroke Paternal Grandfather   . Cancer Maternal Grandmother        leukemia  . Anesthesia problems Neg Hx   . Hypotension Neg Hx   . Malignant hyperthermia Neg Hx   . Pseudochol deficiency Neg Hx     Social History   Socioeconomic History  . Marital status: Married    Spouse name: Not on file  . Number of children: Not on file  . Years of education: Not on file  . Highest education level: Not on file  Occupational History  . Not on file  Social Needs  . Financial resource strain: Not on file  . Food insecurity    Worry: Not on file    Inability: Not on file  . Transportation needs    Medical: Not on file    Non-medical: Not on file  Tobacco Use  . Smoking status: Never Smoker  . Smokeless tobacco: Never Used  Substance and Sexual Activity  . Alcohol use: Yes    Comment: occasional none last 24hrs  .  Drug use: No  . Sexual activity: Yes    Birth control/protection: I.U.D.  Lifestyle  . Physical activity    Days per week: Not on file    Minutes per session: Not on file  . Stress: Not on file  Relationships  . Social Herbalist on phone: Not on file    Gets together: Not on file    Attends religious service: Not on file    Active member of club or organization: Not on file    Attends meetings of clubs or organizations: Not on file    Relationship status: Not on file  . Intimate partner violence    Fear of current or ex partner: Not on file    Emotionally abused: Not on file    Physically abused: Not on file    Forced sexual activity: Not on file  Other Topics Concern  . Not on file  Social History Narrative   ** Merged History Encounter **         Constitutional: Denies fever, malaise, fatigue, headache or abrupt weight  changes.  Respiratory: Denies difficulty breathing, shortness of breath, cough or sputum production.   Cardiovascular: Denies chest pain, chest tightness, palpitations or swelling in the hands or feet.  Skin: Pt reports mass of left arm, right leg. Denies redness, rashes, or ulcercations.   No other specific complaints in a complete review of systems (except as listed in HPI above).  Objective:   Physical Exam  BP 116/78   Pulse 86   Temp 98.3 F (36.8 C) (Temporal)   Wt 214 lb (97.1 kg)   SpO2 98%   BMI 29.85 kg/m  Wt Readings from Last 3 Encounters:  08/03/19 214 lb (97.1 kg)  02/26/19 215 lb (97.5 kg)  11/06/18 205 lb 4 oz (93.1 kg)    General: Appears her stated age, well developed, well nourished in NAD. Skin: 1 cm mobile cyst noted of left forearm. 0.5 cm scaly dermatofibroma noted of right posterior thigh. Cardiovascular: Normal rate and rhythm.  Pulmonary/Chest: Normal effort and positive vesicular breath sounds. No respiratory distress. No wheezes, rales or ronchi noted.  Neurological: Alert and oriented.     BMET    Component Value Date/Time   NA 138 09/05/2017 0918   K 3.9 09/05/2017 0918   CL 101 09/05/2017 0918   CO2 29 09/05/2017 0918   GLUCOSE 135 (H) 09/05/2017 0918   BUN 9 09/05/2017 0918   CREATININE 0.61 09/05/2017 0918   CALCIUM 9.5 09/05/2017 0918   GFRNONAA >60 03/20/2016 1100   GFRAA >60 03/20/2016 1100    Lipid Panel     Component Value Date/Time   CHOL 229 (H) 09/05/2017 0918   TRIG 61.0 09/05/2017 0918   HDL 74.20 09/05/2017 0918   CHOLHDL 3 09/05/2017 0918   VLDL 12.2 09/05/2017 0918   LDLCALC 142 (H) 09/05/2017 0918    CBC    Component Value Date/Time   WBC 6.1 09/05/2017 0918   RBC 4.38 09/05/2017 0918   HGB 13.7 09/05/2017 0918   HCT 41.4 09/05/2017 0918   PLT 250.0 09/05/2017 0918   MCV 94.4 09/05/2017 0918   MCH 30.3 03/20/2016 1100   MCHC 33.1 09/05/2017 0918   RDW 12.9 09/05/2017 0918    Hgb A1C Lab Results   Component Value Date   HGBA1C 9.6 (H) 09/05/2017            Assessment & Plan:   Dermatofibroma of Thigh:  Benign lesions  Reassurance provided Will monitor  Cyst of Left Arm:  Itches but not causing pain Does not desire surgical intervention at this time Referral to derm per her request for monitoring  Return precautions discussed Webb Silversmith, NP

## 2019-08-03 NOTE — Patient Instructions (Signed)
Epidermal Cyst  An epidermal cyst is a small, painless lump under your skin. The cyst contains a grayish-white, bad-smelling substance (keratin). Do not try to pop or open an epidermal cyst yourself. What are the causes?  A blocked hair follicle.  A hair that curls and re-enters the skin instead of growing straight out of the skin.  A blocked pore.  Irritated skin.  An injury to the skin.  Certain conditions that are passed along from parent to child (inherited).  Human papillomavirus (HPV).  Long-term sun damage to the skin. What increases the risk?  Having acne.  Being overweight.  Being 30-40 years old. What are the signs or symptoms? These cysts are usually harmless, but they can get infected. Symptoms of infection may include:  Redness.  Inflammation.  Tenderness.  Warmth.  Fever.  A grayish-white, bad-smelling substance drains from the cyst.  Pus drains from the cyst. How is this treated? In many cases, epidermal cysts go away on their own without treatment. If a cyst becomes infected, treatment may include:  Opening and draining the cyst, done by a doctor. After draining, you may need minor surgery to remove the rest of the cyst.  Antibiotic medicine.  Shots of medicines (steroids) that help to reduce inflammation.  Surgery to remove the cyst. Surgery may be done if the cyst: ? Becomes large. ? Bothers you. ? Has a chance of turning into cancer.  Do not try to open a cyst yourself. Follow these instructions at home:  Take over-the-counter and prescription medicines only as told by your doctor.  If you were prescribed an antibiotic medicine, take it it as told by your doctor. Do not stop using the antibiotic even if you start to feel better.  Keep the area around your cyst clean and dry.  Wear loose, dry clothing.  Avoid touching your cyst.  Check your cyst every day for signs of infection. Check for: ? Redness, swelling, or pain. ? Fluid  or blood. ? Warmth. ? Pus or a bad smell.  Keep all follow-up visits as told by your doctor. This is important. How is this prevented?  Wear clean, dry, clothing.  Avoid wearing tight clothing.  Keep your skin clean and dry. Take showers or baths every day. Contact a doctor if:  Your cyst has symptoms of infection.  Your condition does not improve or gets worse.  You have a cyst that looks different from other cysts you have had.  You have a fever. Get help right away if:  Redness spreads from the cyst into the area close by. Summary  An epidermal cyst is a sac made of skin tissue.  If a cyst becomes infected, treatment may include surgery to open and drain the cyst, or to remove it.  Take over-the-counter and prescription medicines only as told by your doctor.  Contact a doctor if your condition is not improving or is getting worse.  Keep all follow-up visits as told by your doctor. This is important. This information is not intended to replace advice given to you by your health care provider. Make sure you discuss any questions you have with your health care provider. Document Released: 11/15/2004 Document Revised: 01/29/2019 Document Reviewed: 07/17/2018 Elsevier Patient Education  2020 Else[employer] Inc[employer]  

## 2019-08-14 ENCOUNTER — Other Ambulatory Visit: Payer: Self-pay | Admitting: Internal Medicine

## 2019-08-14 MED ORDER — SERTRALINE HCL 50 MG PO TABS: 50.0000 mg | ORAL_TABLET | Freq: Every day | ORAL | 0 refills | Status: DC

## 2019-09-30 ENCOUNTER — Encounter: Payer: Self-pay | Admitting: Internal Medicine

## 2019-09-30 ENCOUNTER — Other Ambulatory Visit: Payer: Self-pay

## 2019-09-30 ENCOUNTER — Ambulatory Visit (INDEPENDENT_AMBULATORY_CARE_PROVIDER_SITE_OTHER): Payer: 59 | Admitting: Internal Medicine

## 2019-09-30 DIAGNOSIS — J019 Acute sinusitis, unspecified: Secondary | ICD-10-CM

## 2019-09-30 DIAGNOSIS — B9689 Other specified bacterial agents as the cause of diseases classified elsewhere: Secondary | ICD-10-CM | POA: Diagnosis not present

## 2019-09-30 MED ORDER — AMOXICILLIN-POT CLAVULANATE 875-125 MG PO TABS: 1.0000 | ORAL_TABLET | Freq: Two times a day (BID) | ORAL | 0 refills | Status: DC

## 2019-09-30 NOTE — Progress Notes (Signed)
Virtual Visit via Video Note  I connected with Karen Byrd on 09/30/19 at  4:30 PM EST by a video enabled telemedicine application and verified that I am speaking with the correct person using two identifiers.  Location: Patient: In her car Provider: Office   I discussed the limitations of evaluation and management by telemedicine and the availability of in person appointments. The patient expressed understanding and agreed to proceed.  History of Present Illness:  Pt reports sinus pressure, watery eyes, nasal congestion and cough. This started 1 week ago. She denies eye redness, eye pain or visual changes. She is blowing yellow  mucous out of her nose. The cough is productive of white mucous. She denies runny nose, ear pain, loss of taste or smell, or shortness of breath. She denies fever, chills or body aches. She has tried Mucinex Sinus, Mucinex D and Zyrtec with minimal relief. She has no known COVID exposure that she is aware of.    Past Medical History:  Diagnosis Date  . Constipation   . Depression   . Diabetes mellitus   . Diabetes mellitus without complication ([place])   . Dysphagia   . VSD (ventricular septal defect)     Current Outpatient Medications  Medication Sig Dispense Refill  . ACZONE 7.5 % GEL Apply 1 application topically daily. 60 g 3  . insulin lispro (HUMALOG) 100 UNIT/ML injection Use up to 70 units daily as directed via insulin pump    . linaclotide (LINZESS) 290 MCG CAPS capsule TAKE 1 CAPSULE (290 MCG TOTAL) BY MOUTH ONCE DAILY    . PARAGARD INTRAUTERINE COPPER IU 1 Device by Intrauterine route. Every 10 years--inserted 2014    . sertraline (ZOLOFT) 50 MG tablet Take 1 tablet (50 mg total) by mouth daily. MUST SCHEDULE ANNUAL EXAM 90 tablet 0   No current facility-administered medications for this visit.     Allergies  Allergen Reactions  . Sulfa Antibiotics Hives    "massive hives"    Family History  Problem Relation Age of Onset  .  Hypertension Mother   . Diabetes Mother   . Hyperlipidemia Father   . Hypertension Father   . Heart disease Maternal Grandfather   . Diabetes Maternal Grandfather   . Stroke Maternal Grandfather   . Hyperlipidemia Paternal Grandmother   . Heart disease Paternal Grandmother   . Hypertension Paternal Grandmother   . Diabetes Paternal Grandmother   . Stroke Paternal Grandmother   . Hyperlipidemia Paternal Grandfather   . Hypertension Paternal Grandfather   . Stroke Paternal Grandfather   . Cancer Maternal Grandmother        leukemia  . Anesthesia problems Neg Hx   . Hypotension Neg Hx   . Malignant hyperthermia Neg Hx   . Pseudochol deficiency Neg Hx     Social History   Socioeconomic History  . Marital status: Married    Spouse name: Not on file  . Number of children: Not on file  . Years of education: Not on file  . Highest education level: Not on file  Occupational History  . Not on file  Social Needs  . Financial resource strain: Not on file  . Food insecurity    Worry: Not on file    Inability: Not on file  . Transportation needs    Medical: Not on file    Non-medical: Not on file  Tobacco Use  . Smoking status: Never Smoker  . Smokeless tobacco: Never Used  Substance and Sexual Activity  .  Alcohol use: Yes    Comment: occasional none last 24hrs  . Drug use: No  . Sexual activity: Yes    Birth control/protection: I.U.D.  Lifestyle  . Physical activity    Days per week: Not on file    Minutes per session: Not on file  . Stress: Not on file  Relationships  . Social Herbalist on phone: Not on file    Gets together: Not on file    Attends religious service: Not on file    Active member of club or organization: Not on file    Attends meetings of clubs or organizations: Not on file    Relationship status: Not on file  . Intimate partner violence    Fear of current or ex partner: Not on file    Emotionally abused: Not on file    Physically  abused: Not on file    Forced sexual activity: Not on file  Other Topics Concern  . Not on file  Social History Narrative   ** Merged History Encounter **         Constitutional: Denies fever, malaise, fatigue, headache or abrupt weight changes.  HEENT: Pt reports sinus pressure, watery eyes, nasal congestion. Denies eye pain, eye redness, ear pain, ringing in the ears, wax buildup, runny nose, bloody nose, or sore throat. Respiratory: Pt reports cough. Denies difficulty breathing, shortness of breath.   Cardiovascular: Denies chest pain, chest tightness, palpitations or swelling in the hands or feet.  Gastrointestinal: Denies abdominal pain, bloating, constipation, diarrhea or blood in the stool.  Skin: Denies redness, rashes, lesions or ulcercations.    No other specific complaints in a complete review of systems (except as listed in HPI above).  Observations/Objective: There were no vitals taken for this visit.  Wt Readings from Last 3 Encounters:  08/03/19 214 lb (97.1 kg)  02/26/19 215 lb (97.5 kg)  11/06/18 205 lb 4 oz (93.1 kg)    General: Appears her stated age, well developed, well nourished in NAD. HEENT:  Nose: does not sound congestion; Throat/Mouth: does not sound hoarse.  Pulmonary/Chest: Normal effort. No respiratory distress.  Neurological: Alert and oriented.  BMET    Component Value Date/Time   NA 138 09/05/2017 0918   K 3.9 09/05/2017 0918   CL 101 09/05/2017 0918   CO2 29 09/05/2017 0918   GLUCOSE 135 (H) 09/05/2017 0918   BUN 9 09/05/2017 0918   CREATININE 0.61 09/05/2017 0918   CALCIUM 9.5 09/05/2017 0918   GFRNONAA >60 03/20/2016 1100   GFRAA >60 03/20/2016 1100    Lipid Panel     Component Value Date/Time   CHOL 229 (H) 09/05/2017 0918   TRIG 61.0 09/05/2017 0918   HDL 74.20 09/05/2017 0918   CHOLHDL 3 09/05/2017 0918   VLDL 12.2 09/05/2017 0918   LDLCALC 142 (H) 09/05/2017 0918    CBC    Component Value Date/Time   WBC 6.1  09/05/2017 0918   RBC 4.38 09/05/2017 0918   HGB 13.7 09/05/2017 0918   HCT 41.4 09/05/2017 0918   PLT 250.0 09/05/2017 0918   MCV 94.4 09/05/2017 0918   MCH 30.3 03/20/2016 1100   MCHC 33.1 09/05/2017 0918   RDW 12.9 09/05/2017 0918    Hgb A1C Lab Results  Component Value Date   HGBA1C 9.6 (H) 09/05/2017       Assessment and Plan:  Sinus Headache, Watery Eyes, Nasal Congestion or Cough:  Likely bacterial sinusitis Start Flonase OTC RX  for Augmentin BID x 10 days  Let me know if this persists or worsens   Follow Up Instructions:    I discussed the assessment and treatment plan with the patient. The patient was provided an opportunity to ask questions and all were answered. The patient agreed with the plan and demonstrated an understanding of the instructions.   The patient was advised to call back or seek an in-person evaluation if the symptoms worsen or if the condition fails to improve as anticipated.     Webb Silversmith, NP

## 2019-09-30 NOTE — Patient Instructions (Signed)

## 2019-11-17 ENCOUNTER — Encounter: Payer: Self-pay | Admitting: Internal Medicine

## 2019-12-25 ENCOUNTER — Encounter: Payer: Self-pay | Admitting: Internal Medicine

## 2019-12-29 ENCOUNTER — Encounter: Payer: Self-pay | Admitting: Internal Medicine

## 2019-12-29 ENCOUNTER — Ambulatory Visit: Payer: 59 | Admitting: Internal Medicine

## 2019-12-29 ENCOUNTER — Other Ambulatory Visit: Payer: Self-pay

## 2019-12-29 VITALS — BP 114/76 | HR 80 | Temp 98.2°F | Wt 221.0 lb

## 2019-12-29 DIAGNOSIS — R635 Abnormal weight gain: Secondary | ICD-10-CM | POA: Diagnosis not present

## 2019-12-29 DIAGNOSIS — R232 Flushing: Secondary | ICD-10-CM | POA: Diagnosis not present

## 2019-12-29 DIAGNOSIS — R222 Localized swelling, mass and lump, trunk: Secondary | ICD-10-CM

## 2019-12-29 DIAGNOSIS — R5383 Other fatigue: Secondary | ICD-10-CM

## 2019-12-29 LAB — FOLLICLE STIMULATING HORMONE: FSH: 7.4 m[IU]/mL

## 2019-12-29 LAB — TSH: TSH: 1.65 u[IU]/mL (ref 0.35–4.50)

## 2019-12-29 LAB — VITAMIN D 25 HYDROXY (VIT D DEFICIENCY, FRACTURES): VITD: 24.78 ng/mL — ABNORMAL LOW (ref 30.00–100.00)

## 2019-12-29 LAB — LUTEINIZING HORMONE: LH: 8.14 m[IU]/mL

## 2019-12-29 LAB — VITAMIN B12: Vitamin B-12: 931 pg/mL — ABNORMAL HIGH (ref 211–911)

## 2019-12-29 NOTE — Patient Instructions (Signed)
Epidermal Cyst  An epidermal cyst is a small, painless lump under your skin. The cyst contains a grayish-white, bad-smelling substance (keratin). Do not try to pop or open an epidermal cyst yourself. What are the causes?  A blocked hair follicle.  A hair that curls and re-enters the skin instead of growing straight out of the skin.  A blocked pore.  Irritated skin.  An injury to the skin.  Certain conditions that are passed along from parent to child (inherited).  Human papillomavirus (HPV).  Long-term sun damage to the skin. What increases the risk?  Having acne.  Being overweight.  Being 30-40 years old. What are the signs or symptoms? These cysts are usually harmless, but they can get infected. Symptoms of infection may include:  Redness.  Inflammation.  Tenderness.  Warmth.  Fever.  A grayish-white, bad-smelling substance drains from the cyst.  Pus drains from the cyst. How is this treated? In many cases, epidermal cysts go away on their own without treatment. If a cyst becomes infected, treatment may include:  Opening and draining the cyst, done by a doctor. After draining, you may need minor surgery to remove the rest of the cyst.  Antibiotic medicine.  Shots of medicines (steroids) that help to reduce inflammation.  Surgery to remove the cyst. Surgery may be done if the cyst: ? Becomes large. ? Bothers you. ? Has a chance of turning into cancer.  Do not try to open a cyst yourself. Follow these instructions at home:  Take over-the-counter and prescription medicines only as told by your doctor.  If you were prescribed an antibiotic medicine, take it it as told by your doctor. Do not stop using the antibiotic even if you start to feel better.  Keep the area around your cyst clean and dry.  Wear loose, dry clothing.  Avoid touching your cyst.  Check your cyst every day for signs of infection. Check for: ? Redness, swelling, or pain. ? Fluid  or blood. ? Warmth. ? Pus or a bad smell.  Keep all follow-up visits as told by your doctor. This is important. How is this prevented?  Wear clean, dry, clothing.  Avoid wearing tight clothing.  Keep your skin clean and dry. Take showers or baths every day. Contact a doctor if:  Your cyst has symptoms of infection.  Your condition does not improve or gets worse.  You have a cyst that looks different from other cysts you have had.  You have a fever. Get help right away if:  Redness spreads from the cyst into the area close by. Summary  An epidermal cyst is a sac made of skin tissue.  If a cyst becomes infected, treatment may include surgery to open and drain the cyst, or to remove it.  Take over-the-counter and prescription medicines only as told by your doctor.  Contact a doctor if your condition is not improving or is getting worse.  Keep all follow-up visits as told by your doctor. This is important. This information is not intended to replace advice given to you by your health care provider. Make sure you discuss any questions you have with your health care provider. Document Revised: 01/29/2019 Document Reviewed: 07/17/2018 Elsevier Patient Education  2020 [employer]  

## 2019-12-29 NOTE — Progress Notes (Signed)
Subjective:    Patient ID: Karen Byrd, female    DOB: 08-Sep-1981, 39 y.o.   MRN: [id number]  HPI  Pt presents to the clinic today with c/o a mass above her right collar bone. She noticed this 3 days ago. It is not tender and she does not think it has grown in size. She has not noticed any masses in her right armpit or right breast. She has had a mammogram in the past at [facility].  She also reports fatigue, hot flashes and weight gain. This has been more noticeable over the last few months. She continues to have monthly periods. She is stressed with work but denies other changes in diet or activity level.  Review of Systems      Past Medical History:  Diagnosis Date  . Constipation   . Depression   . Diabetes mellitus   . Diabetes mellitus without complication ([place])   . Dysphagia   . VSD (ventricular septal defect)     Current Outpatient Medications  Medication Sig Dispense Refill  . ACZONE 7.5 % GEL Apply 1 application topically daily. 60 g 3  . insulin lispro (HUMALOG) 100 UNIT/ML injection Use up to 70 units daily as directed via insulin pump    . linaclotide (LINZESS) 290 MCG CAPS capsule TAKE 1 CAPSULE (290 MCG TOTAL) BY MOUTH ONCE DAILY    . PARAGARD INTRAUTERINE COPPER IU 1 Device by Intrauterine route. Every 10 years--inserted 2014    . sertraline (ZOLOFT) 50 MG tablet Take 1 tablet (50 mg total) by mouth daily. MUST SCHEDULE ANNUAL EXAM 90 tablet 0   No current facility-administered medications for this visit.    Allergies  Allergen Reactions  . Sulfa Antibiotics Hives    "massive hives"    Family History  Problem Relation Age of Onset  . Hypertension Mother   . Diabetes Mother   . Hyperlipidemia Father   . Hypertension Father   . Heart disease Maternal Grandfather   . Diabetes Maternal Grandfather   . Stroke Maternal Grandfather   . Hyperlipidemia Paternal Grandmother   . Heart disease Paternal Grandmother   . Hypertension Paternal Grandmother   .  Diabetes Paternal Grandmother   . Stroke Paternal Grandmother   . Hyperlipidemia Paternal Grandfather   . Hypertension Paternal Grandfather   . Stroke Paternal Grandfather   . Cancer Maternal Grandmother        leukemia  . Anesthesia problems Neg Hx   . Hypotension Neg Hx   . Malignant hyperthermia Neg Hx   . Pseudochol deficiency Neg Hx     Social History   Socioeconomic History  . Marital status: Married    Spouse name: Not on file  . Number of children: Not on file  . Years of education: Not on file  . Highest education level: Not on file  Occupational History  . Not on file  Tobacco Use  . Smoking status: Never Smoker  . Smokeless tobacco: Never Used  Substance and Sexual Activity  . Alcohol use: Yes    Comment: occasional none last 24hrs  . Drug use: No  . Sexual activity: Yes    Birth control/protection: I.U.D.  Other Topics Concern  . Not on file  Social History Narrative   ** Merged History Encounter **       Social Determinants of Health   Financial Resource Strain:   . Difficulty of Paying Living Expenses: Not on file  Food Insecurity:   . Worried About Running  Out of Food in the Last Year: Not on file  . Ran Out of Food in the Last Year: Not on file  Transportation Needs:   . Lack of Transportation (Medical): Not on file  . Lack of Transportation (Non-Medical): Not on file  Physical Activity:   . Days of Exercise per Week: Not on file  . Minutes of Exercise per Session: Not on file  Stress:   . Feeling of Stress : Not on file  Social Connections:   . Frequency of Communication with Friends and Family: Not on file  . Frequency of Social Gatherings with Friends and Family: Not on file  . Attends Religious Services: Not on file  . Active Member of Clubs or Organizations: Not on file  . Attends Archivist Meetings: Not on file  . Marital Status: Not on file  Intimate Partner Violence:   . Fear of Current or Ex-Partner: Not on file  .  Emotionally Abused: Not on file  . Physically Abused: Not on file  . Sexually Abused: Not on file     Constitutional: Pt reports fatigue, weight gain. Denies fever, malaise, headache.  Respiratory: Denies difficulty breathing, shortness of breath, cough or sputum production.   Cardiovascular: Denies chest pain, chest tightness, palpitations or swelling in the hands or feet.  Skin: Pt reports mass above right collar bone. Denies redness, rashes, or ulcercations.  Neurological: Pt reports hot flashes. Denies dizziness, difficulty with memory, difficulty with speech or problems with balance and coordination.    No other specific complaints in a complete review of systems (except as listed in HPI above).  Objective:   Physical Exam   BP 114/76   Pulse 80   Temp 98.2 F (36.8 C) (Temporal)   Wt 221 lb (100.2 kg)   SpO2 97%   BMI 30.82 kg/m  Wt Readings from Last 3 Encounters:  12/29/19 221 lb (100.2 kg)  08/03/19 214 lb (97.1 kg)  02/26/19 215 lb (97.5 kg)    General: Appears her stated age, well developed, well nourished in NAD. Skin: Right breast without fibrocystic changes, mass or lump. No discharge expressed from the nipple. No axillary lymphadenopathy noted. HEENT: Head: normal shape and size;   Neck:  Neck supple, trachea midline. Mobile 0.75 cm round mass above the right collarbone. No  thyromegaly present.  Cardiovascular: Normal rate and rhythm. S1,S2 noted.  No murmur, rubs or gallops noted.  Pulmonary/Chest: Normal effort and positive vesicular breath sounds. No respiratory distress. No wheezes, rales or ronchi noted.  Neurological: Alert and oriented.   BMET    Component Value Date/Time   NA 138 09/05/2017 0918   K 3.9 09/05/2017 0918   CL 101 09/05/2017 0918   CO2 29 09/05/2017 0918   GLUCOSE 135 (H) 09/05/2017 0918   BUN 9 09/05/2017 0918   CREATININE 0.61 09/05/2017 0918   CALCIUM 9.5 09/05/2017 0918   GFRNONAA >60 03/20/2016 1100   GFRAA >60 03/20/2016  1100    Lipid Panel     Component Value Date/Time   CHOL 229 (H) 09/05/2017 0918   TRIG 61.0 09/05/2017 0918   HDL 74.20 09/05/2017 0918   CHOLHDL 3 09/05/2017 0918   VLDL 12.2 09/05/2017 0918   LDLCALC 142 (H) 09/05/2017 0918    CBC    Component Value Date/Time   WBC 6.1 09/05/2017 0918   RBC 4.38 09/05/2017 0918   HGB 13.7 09/05/2017 0918   HCT 41.4 09/05/2017 0918   PLT 250.0 09/05/2017 [id number]  MCV 94.4 09/05/2017 0918   MCH 30.3 03/20/2016 1100   MCHC 33.1 09/05/2017 0918   RDW 12.9 09/05/2017 0918    Hgb A1C Lab Results  Component Value Date   HGBA1C 9.6 (H) 09/05/2017         Assessment & Plan:   Supraclavicular Mass, Right Collarbone:  Will obtain US soft tissue head/neck Breast exam benign  Fatigue, Weight Gain and Hot Flashes:  Likely due to stress Will check TSH, Vit D, B12, FSH and LH today  Will follow up after labs and imaging, return precautions discussed Webb Silversmith, NP This visit occurred during the SARS-CoV-2 public health emergency.  Safety protocols were in place, including screening questions prior to the visit, additional usage of staff PPE, and extensive cleaning of exam room while observing appropriate contact time as indicated for disinfecting solutions.

## 2020-01-04 ENCOUNTER — Ambulatory Visit
Admission: RE | Admit: 2020-01-04 | Discharge: 2020-01-04 | Disposition: A | Discharge status: Home / Self Care | Payer: 59 | Source: Ambulatory Visit | Attending: Internal Medicine | Admitting: Internal Medicine

## 2020-01-04 ENCOUNTER — Other Ambulatory Visit: Payer: Self-pay

## 2020-01-04 DIAGNOSIS — R222 Localized swelling, mass and lump, trunk: Secondary | ICD-10-CM | POA: Diagnosis not present

## 2020-02-27 ENCOUNTER — Other Ambulatory Visit: Payer: Self-pay | Admitting: Internal Medicine

## 2020-03-24 ENCOUNTER — Other Ambulatory Visit: Payer: Self-pay

## 2020-03-24 ENCOUNTER — Emergency Department: Payer: 59

## 2020-03-24 ENCOUNTER — Telehealth: Payer: Self-pay

## 2020-03-24 ENCOUNTER — Emergency Department
Admission: EM | Admit: 2020-03-24 | Discharge: 2020-03-24 | Disposition: A | Discharge status: Home / Self Care | Payer: 59 | Attending: Student in an Organized Health Care Education/Training Program | Admitting: Student in an Organized Health Care Education/Training Program

## 2020-03-24 DIAGNOSIS — E109 Type 1 diabetes mellitus without complications: Secondary | ICD-10-CM | POA: Insufficient documentation

## 2020-03-24 DIAGNOSIS — R112 Nausea with vomiting, unspecified: Secondary | ICD-10-CM | POA: Diagnosis not present

## 2020-03-24 DIAGNOSIS — R1032 Left lower quadrant pain: Secondary | ICD-10-CM | POA: Diagnosis present

## 2020-03-24 DIAGNOSIS — E860 Dehydration: Secondary | ICD-10-CM | POA: Diagnosis not present

## 2020-03-24 DIAGNOSIS — Z20822 Contact with and (suspected) exposure to covid-19: Secondary | ICD-10-CM | POA: Diagnosis not present

## 2020-03-24 DIAGNOSIS — Z9641 Presence of insulin pump (external) (internal): Secondary | ICD-10-CM | POA: Diagnosis not present

## 2020-03-24 LAB — CBC
HCT: 37.5 % (ref 36.0–46.0)
Hemoglobin: 12.9 g/dL (ref 12.0–15.0)
MCH: 31.5 pg (ref 26.0–34.0)
MCHC: 34.4 g/dL (ref 30.0–36.0)
MCV: 91.5 fL (ref 80.0–100.0)
Platelets: 218 10*3/uL (ref 150–400)
RBC: 4.1 MIL/uL (ref 3.87–5.11)
RDW: 12.8 % (ref 11.5–15.5)
WBC: 11.4 10*3/uL — ABNORMAL HIGH (ref 4.0–10.5)
nRBC: 0 % (ref 0.0–0.2)

## 2020-03-24 LAB — COMPREHENSIVE METABOLIC PANEL
ALT: 22 U/L (ref 0–44)
AST: 29 U/L (ref 15–41)
Albumin: 3.8 g/dL (ref 3.5–5.0)
Alkaline Phosphatase: 79 U/L (ref 38–126)
Anion gap: 10 (ref 5–15)
BUN: 13 mg/dL (ref 6–20)
CO2: 25 mmol/L (ref 22–32)
Calcium: 8.7 mg/dL — ABNORMAL LOW (ref 8.9–10.3)
Chloride: 95 mmol/L — ABNORMAL LOW (ref 98–111)
Creatinine, Ser: 0.73 mg/dL (ref 0.44–1.00)
GFR calc Af Amer: >60 mL/min (ref >60)
GFR calc non Af Amer: >60 mL/min (ref >60)
Glucose, Bld: 250 mg/dL — ABNORMAL HIGH (ref 70–99)
Potassium: 3.5 mmol/L (ref 3.5–5.1)
Sodium: 130 mmol/L — ABNORMAL LOW (ref 135–145)
Total Bilirubin: 2.6 mg/dL — ABNORMAL HIGH (ref 0.3–1.2)
Total Protein: 7.2 g/dL (ref 6.5–8.1)

## 2020-03-24 LAB — URINALYSIS, COMPLETE (UACMP) WITH MICROSCOPIC
Bacteria, UA: NONE SEEN
Bilirubin Urine: NEGATIVE
Glucose, UA: NEGATIVE mg/dL
Ketones, ur: 5 mg/dL — AB
Leukocytes,Ua: NEGATIVE
Nitrite: NEGATIVE
Protein, ur: 30 mg/dL — AB
Specific Gravity, Urine: >1.046 — ABNORMAL HIGH (ref 1.005–1.030)
pH: 6 (ref 5.0–8.0)

## 2020-03-24 LAB — HEPATIC FUNCTION PANEL
ALT: 21 U/L (ref 0–44)
AST: 55 U/L — ABNORMAL HIGH (ref 15–41)
Albumin: 3.5 g/dL (ref 3.5–5.0)
Alkaline Phosphatase: 80 U/L (ref 38–126)
Bilirubin, Direct: 0.3 mg/dL — ABNORMAL HIGH (ref 0.0–0.2)
Indirect Bilirubin: 1.8 mg/dL — ABNORMAL HIGH (ref 0.3–0.9)
Total Bilirubin: 2.1 mg/dL — ABNORMAL HIGH (ref 0.3–1.2)
Total Protein: 6.9 g/dL (ref 6.5–8.1)

## 2020-03-24 LAB — GLUCOSE, CAPILLARY
Glucose-Capillary: 241 mg/dL — ABNORMAL HIGH (ref 70–99)
Glucose-Capillary: 153 mg/dL — ABNORMAL HIGH (ref 70–99)

## 2020-03-24 LAB — TROPONIN I (HIGH SENSITIVITY): Troponin I (High Sensitivity): 5 ng/L (ref <18)

## 2020-03-24 LAB — SARS CORONAVIRUS 2 BY RT PCR (HOSPITAL ORDER, PERFORMED IN ~~LOC~~ HOSPITAL LAB): SARS Coronavirus 2: NEGATIVE

## 2020-03-24 LAB — LIPASE, BLOOD: Lipase: 20 U/L (ref 11–51)

## 2020-03-24 LAB — HCG, QUANTITATIVE, PREGNANCY: hCG, Beta Chain, Quant, S: 1 m[IU]/mL (ref <5)

## 2020-03-24 MED ORDER — SODIUM CHLORIDE 0.9 % IV BOLUS
1000.0000 mL | Freq: Once | INTRAVENOUS | Status: AC
  Administered 2020-03-24: 1000 mL via INTRAVENOUS

## 2020-03-24 MED ORDER — ONDANSETRON 4 MG PO TBDP
4.0000 mg | ORAL_TABLET | Freq: Once | ORAL | Status: AC | PRN
  Administered 2020-03-24: 4 mg via ORAL
  Filled 2020-03-24: qty 1

## 2020-03-24 MED ORDER — LACTATED RINGERS IV BOLUS: 1000.0000 mL | Freq: Once | INTRAVENOUS | Status: DC

## 2020-03-24 MED ORDER — SODIUM CHLORIDE 0.9% FLUSH: 3.0000 mL | Freq: Once | INTRAVENOUS | Status: DC

## 2020-03-24 MED ORDER — PROMETHAZINE HCL 12.5 MG PO TABS: 12.5000 mg | ORAL_TABLET | Freq: Four times a day (QID) | ORAL | 0 refills | Status: DC | PRN

## 2020-03-24 MED ORDER — MORPHINE SULFATE (PF) 4 MG/ML IV SOLN
4.0000 mg | INTRAVENOUS | Status: DC | PRN
  Administered 2020-03-24: 4 mg via INTRAVENOUS
  Filled 2020-03-24: qty 1

## 2020-03-24 MED ORDER — DOXYCYCLINE HYCLATE 100 MG PO TABS
100.0000 mg | ORAL_TABLET | Freq: Once | ORAL | Status: AC
  Administered 2020-03-24: 100 mg via ORAL
  Filled 2020-03-24: qty 1

## 2020-03-24 MED ORDER — PROBIOTIC 250 MG PO CAPS
1.0000 | ORAL_CAPSULE | Freq: Two times a day (BID) | ORAL | 0 refills | Status: AC | PRN
Start: 2020-03-24 — End: 2020-04-03

## 2020-03-24 MED ORDER — DOXYCYCLINE HYCLATE 100 MG PO TABS
100.0000 mg | ORAL_TABLET | Freq: Two times a day (BID) | ORAL | 0 refills | Status: AC
Start: 2020-03-24 — End: 2020-03-31

## 2020-03-24 MED ORDER — IOHEXOL 300 MG/ML  SOLN
100.0000 mL | Freq: Once | INTRAMUSCULAR | Status: AC | PRN
  Administered 2020-03-24: 100 mL via INTRAVENOUS
  Filled 2020-03-24: qty 100

## 2020-03-24 MED ORDER — PROMETHAZINE HCL 25 MG/ML IJ SOLN
12.5000 mg | Freq: Four times a day (QID) | INTRAMUSCULAR | Status: DC | PRN
  Administered 2020-03-24: 12.5 mg via INTRAVENOUS
  Filled 2020-03-24: qty 1

## 2020-03-24 MED ORDER — HYDROMORPHONE HCL 1 MG/ML IJ SOLN
0.5000 mg | INTRAMUSCULAR | Status: DC | PRN
  Administered 2020-03-24: 0.5 mg via INTRAVENOUS
  Filled 2020-03-24: qty 1

## 2020-03-24 MED ORDER — METOCLOPRAMIDE HCL 5 MG/ML IJ SOLN
10.0000 mg | Freq: Once | INTRAMUSCULAR | Status: AC
  Administered 2020-03-24: 10 mg via INTRAVENOUS
  Filled 2020-03-24: qty 2

## 2020-03-24 NOTE — ED Notes (Signed)
cbg 153

## 2020-03-24 NOTE — ED Provider Notes (Signed)
[facility] Emergency Department Provider Note    First MD Initiated Contact with Patient 03/24/20 1201     (approximate)  I have reviewed the triage vital signs and the nursing notes.   HISTORY  Chief Complaint Abdominal Pain    HPI Karen Byrd is a 39 y.o. female below listed past medical history type I diabetic on insulin pump presents to the ER for 24 to 48 hours of chills nausea vomiting inability to keep any fluid down as well as muscle aches and headaches.  Did have a temperature to 102 last night.  She is having are to severe left side and left upper quadrant abdominal pain.  She status post cholecystectomy and appendectomy.  She is been checking her urine ketones at home and has not had any.  Denies any tick bites.  She is vaccinated for Covid.  She denies any cough or congestion.  No chest pain.    Past Medical History:  Diagnosis Date  . Constipation   . Depression   . Diabetes mellitus   . Diabetes mellitus without complication ([place])   . Dysphagia   . VSD (ventricular septal defect)    Family History  Problem Relation Age of Onset  . Hypertension Mother   . Diabetes Mother   . Hyperlipidemia Father   . Hypertension Father   . Heart disease Maternal Grandfather   . Diabetes Maternal Grandfather   . Stroke Maternal Grandfather   . Hyperlipidemia Paternal Grandmother   . Heart disease Paternal Grandmother   . Hypertension Paternal Grandmother   . Diabetes Paternal Grandmother   . Stroke Paternal Grandmother   . Hyperlipidemia Paternal Grandfather   . Hypertension Paternal Grandfather   . Stroke Paternal Grandfather   . Cancer Maternal Grandmother        leukemia  . Anesthesia problems Neg Hx   . Hypotension Neg Hx   . Malignant hyperthermia Neg Hx   . Pseudochol deficiency Neg Hx    Past Surgical History:  Procedure Laterality Date  . APPENDECTOMY    . CHOLECYSTECTOMY    . COLONOSCOPY WITH PROPOFOL N/A 04/29/2018   Procedure: COLONOSCOPY WITH PROPOFOL;  Surgeon: Toledo, Benay Pike, MD;  Location: [facility] ENDOSCOPY;  Service: Gastroenterology;  Laterality: N/A;  . DILATION AND CURETTAGE OF UTERUS  2014  . ESOPHAGOGASTRODUODENOSCOPY (EGD) WITH PROPOFOL N/A 04/29/2018   Procedure: ESOPHAGOGASTRODUODENOSCOPY (EGD) WITH PROPOFOL;  Surgeon: Toledo, Benay Pike, MD;  Location: [facility] ENDOSCOPY;  Service: Gastroenterology;  Laterality: N/A;  . TONSILLECTOMY    . VSD REPAIR  May 2000  . VSD REPAIR     Patient Active Problem List   Diagnosis Date Noted  . Type 1 diabetes mellitus with hyperglycemia ([place]) 08/03/2019  . Anxiety and depression 09/05/2017  . IBS (irritable bowel syndrome) 09/05/2017      Prior to Admission medications   Medication Sig Start Date End Date Taking? Authorizing Provider  ACZONE 7.5 % GEL Apply 1 application topically daily. 08/03/19  Yes Jearld Fenton, NP  insulin lispro (HUMALOG) 100 UNIT/ML injection Use up to 70 units daily as directed via insulin pump 12/09/18  Yes [provider]  linaclotide (LINZESS) 290 MCG CAPS capsule TAKE 1 CAPSULE (290 MCG TOTAL) BY MOUTH ONCE DAILY 12/18/18  Yes [provider]  PARAGARD INTRAUTERINE COPPER IU 1 Device by Intrauterine route. Every 10 years--inserted 2014   Yes [provider]  sertraline (ZOLOFT) 50 MG tablet TAKE 1 TABLET (50 MG TOTAL) BY MOUTH DAILY.  MUST SCHEDULE ANNUAL EXAM 03/01/20  Yes Baity, Coralie Keens, NP  doxycycline (VIBRA-TABS) 100 MG tablet Take 1 tablet (100 mg total) by mouth 2 (two) times daily for 7 days. 03/24/20 03/31/20  Merlyn Lot, MD  promethazine (PHENERGAN) 12.5 MG tablet Take 1 tablet (12.5 mg total) by mouth every 6 (six) hours as needed. 03/24/20   Merlyn Lot, MD  Saccharomyces boulardii (PROBIOTIC) 250 MG CAPS Take 1 capsule by mouth 3 times/day as needed-between meals & bedtime for up to 10 days. 03/24/20 04/03/20  Merlyn Lot, MD    Allergies Sulfa antibiotics    Social  History Social History   Tobacco Use  . Smoking status: Never Smoker  . Smokeless tobacco: Never Used  Substance Use Topics  . Alcohol use: Yes    Comment: occasional none last 24hrs  . Drug use: No    Review of Systems Patient denies headaches, rhinorrhea, blurry vision, numbness, shortness of breath, chest pain, edema, cough, abdominal pain, nausea, vomiting, diarrhea, dysuria, fevers, rashes or hallucinations unless otherwise stated above in HPI. ____________________________________________   PHYSICAL EXAM:  VITAL SIGNS: Vitals:   03/24/20 1029 03/24/20 1636  BP: (!) 122/55 (!) 114/54  Pulse: 92 82  Resp: 18 20  Temp: 98.6 F (37 C)   SpO2: 98% 99%    Constitutional: Alert and oriented. Nontoxic appearing Eyes: Conjunctivae are normal.  Head: Atraumatic. Nose: No congestion/rhinnorhea. Mouth/Throat: Mucous membranes are moist.   Neck: No stridor. Painless ROM.  Cardiovascular: Normal rate, regular rhythm. Grossly normal heart sounds.  Good peripheral circulation. Respiratory: Normal respiratory effort.  No retractions. Lungs CTAB. Gastrointestinal: Soft  But with mild ttp in epigastric and luq.  No distention. No abdominal bruits. No CVA tenderness. Genitourinary: deferred Musculoskeletal: No lower extremity tenderness nor edema.  No joint effusions. Neurologic:  Normal speech and language. No gross focal neurologic deficits are appreciated. No facial droop Skin:  Skin is warm, dry and intact. No rash noted. Psychiatric: Mood and affect are normal. Speech and behavior are normal.  ____________________________________________   LABS (all labs ordered are listed, but only abnormal results are displayed)  Results for orders placed or performed during the hospital encounter of 03/24/20 (from the past 24 hour(s))  Lipase, blood     Status: None   Collection Time: 03/24/20 10:30 AM  Result Value Ref Range   Lipase 20 11 - 51 U/L  Comprehensive metabolic panel      Status: Abnormal   Collection Time: 03/24/20 10:30 AM  Result Value Ref Range   Sodium 130 (L) 135 - 145 mmol/L   Potassium 3.5 3.5 - 5.1 mmol/L   Chloride 95 (L) 98 - 111 mmol/L   CO2 25 22 - 32 mmol/L   Glucose, Bld 250 (H) 70 - 99 mg/dL   BUN 13 6 - 20 mg/dL   Creatinine, Ser 0.73 0.44 - 1.00 mg/dL   Calcium 8.7 (L) 8.9 - 10.3 mg/dL   Total Protein 7.2 6.5 - 8.1 g/dL   Albumin 3.8 3.5 - 5.0 g/dL   AST 29 15 - 41 U/L   ALT 22 0 - 44 U/L   Alkaline Phosphatase 79 38 - 126 U/L   Total Bilirubin 2.6 (H) 0.3 - 1.2 mg/dL   GFR calc non Af Amer >60 >60 mL/min   GFR calc Af Amer >60 >60 mL/min   Anion gap 10 5 - 15  CBC     Status: Abnormal   Collection Time: 03/24/20 10:30 AM  Result Value  Ref Range   WBC 11.4 (H) 4.0 - 10.5 K/uL   RBC 4.10 3.87 - 5.11 MIL/uL   Hemoglobin 12.9 12.0 - 15.0 g/dL   HCT 37.5 36.0 - 46.0 %   MCV 91.5 80.0 - 100.0 fL   MCH 31.5 26.0 - 34.0 pg   MCHC 34.4 30.0 - 36.0 g/dL   RDW 12.8 11.5 - 15.5 %   Platelets 218 150 - 400 K/uL   nRBC 0.0 0.0 - 0.2 %  Glucose, capillary     Status: Abnormal   Collection Time: 03/24/20 10:30 AM  Result Value Ref Range   Glucose-Capillary 241 (H) 70 - 99 mg/dL  hCG, quantitative, pregnancy     Status: None   Collection Time: 03/24/20 10:30 AM  Result Value Ref Range   hCG, Beta Chain, Quant, S 1 <5 mIU/mL  Troponin I (High Sensitivity)     Status: None   Collection Time: 03/24/20 10:30 AM  Result Value Ref Range   Troponin I (High Sensitivity) 5 <18 ng/L  Urinalysis, Complete w Microscopic     Status: Abnormal   Collection Time: 03/24/20 12:45 PM  Result Value Ref Range   Color, Urine YELLOW (A) YELLOW   APPearance CLEAR (A) CLEAR   Specific Gravity, Urine >1.046 (H) 1.005 - 1.030   pH 6.0 5.0 - 8.0   Glucose, UA NEGATIVE NEGATIVE mg/dL   Hgb urine dipstick SMALL (A) NEGATIVE   Bilirubin Urine NEGATIVE NEGATIVE   Ketones, ur 5 (A) NEGATIVE mg/dL   Protein, ur 30 (A) NEGATIVE mg/dL   Nitrite NEGATIVE  NEGATIVE   Leukocytes,Ua NEGATIVE NEGATIVE   RBC / HPF 6-10 0 - 5 RBC/hpf   WBC, UA 0-5 0 - 5 WBC/hpf   Bacteria, UA NONE SEEN NONE SEEN   Squamous Epithelial / LPF 6-10 0 - 5  SARS Coronavirus 2 by RT PCR (hospital order, performed in [place] hospital lab) Nasopharyngeal Nasopharyngeal Swab     Status: None   Collection Time: 03/24/20 12:45 PM   Specimen: Nasopharyngeal Swab  Result Value Ref Range   SARS Coronavirus 2 NEGATIVE NEGATIVE  Glucose, capillary     Status: Abnormal   Collection Time: 03/24/20  3:53 PM  Result Value Ref Range   Glucose-Capillary 153 (H) 70 - 99 mg/dL  Hepatic function panel     Status: Abnormal   Collection Time: 03/24/20  5:40 PM  Result Value Ref Range   Total Protein 6.9 6.5 - 8.1 g/dL   Albumin 3.5 3.5 - 5.0 g/dL   AST 55 (H) 15 - 41 U/L   ALT 21 0 - 44 U/L   Alkaline Phosphatase 80 38 - 126 U/L   Total Bilirubin 2.1 (H) 0.3 - 1.2 mg/dL   Bilirubin, Direct 0.3 (H) 0.0 - 0.2 mg/dL   Indirect Bilirubin 1.8 (H) 0.3 - 0.9 mg/dL   ____________________________________________  EKG My review and personal interpretation at Time: 15:40   Indication: N/v  Rate: 75  Rhythm: sinus Axis: normal Other: normal intervals, no stemi ____________________________________________  RADIOLOGY  I personally reviewed all radiographic images ordered to evaluate for the above acute complaints and reviewed radiology reports and findings.  These findings were personally discussed with the patient.  Please see medical record for radiology report.  ____________________________________________   PROCEDURES  Procedure(s) performed:  Procedures    Critical Care performed: no ____________________________________________   INITIAL IMPRESSION / ASSESSMENT AND PLAN / ED COURSE  Pertinent labs & imaging results that were available during  my care of the patient were reviewed by me and considered in my medical decision making (see chart for details).   DDX:  pancreatitis, pyelonephritis, dehydration, covid, tick born illness, pna, enteritis, gastritis,, cholangitis  MARRIAN GEMMILL is a 39 y.o. who presents to the ED with symptoms as described above.  Patient nontoxic-appearing but reporting 24 hours it is inability tolerate p.o.  She is having some epigastric pain.  No lower abdominal pain.  She is status post cholecystectomy and appendectomy.  Describing some thing of flulike illness.  Receive her Covid vaccine.  Extensive work-up will be ordered to evaluate for the above complaints.  Fortunately she is not febrile or tachycardic at this time but does appear clinically dehydrated.  Will give IV fluids.  Given location of pain and borderline elevation of her bilirubin will order CT imaging.  This may be secondary to dehydration.  Denies any right upper quadrant pain lipase is normal.  LFTs are otherwise normal.  The patient will be placed on continuous pulse oximetry and telemetry for monitoring.  Laboratory evaluation will be sent to evaluate for the above complaints.     Clinical Course as of Mar 24 1857  Thu Mar 24, 2020  1431 Patient reassessed.  Still feels abdominal pain.  Will order additional pain medication IV fluids and antiemetic.   [PR]  25 CT imaging is reassuring.  No evidence of biliary dilatation pathology.  Nothing to explain the left-sided abdominal pain.  Still awaiting urinalysis.  Reassessed and the patient appears to be clinically improving.   [PR]  1711 Patient was able to tolerate PO and was able to ambulate with a steady gait.    [PR]  1825 Blood work is reassuring.  Repeat LFTs are stable and actually improved after hydration.  Urinalysis was significantly concentrated specific gravity therefore suspect a large component of dehydration but has received 2 L and feels significantly improved.  She is not cholangitic.  No evidence to suggest sepsis.  No symptoms to suggest a hemolytic anemia.  She is well and nontoxic at this time.   Tolerating p.o. and ambulating with steady gait.  I am to cover with doxycycline given findings on chest x-ray reported fever yesterday and mild white count.  Her his abdominal exam does remind benign.  This may be viral in etiology but she seems to be clinically improving.  Will ensure patient is able to tolerate p.o.   [PR]    Clinical Course User Index [PR] Merlyn Lot, MD    The patient was evaluated in Emergency Department today for the symptoms described in the history of present illness. He/she was evaluated in the context of the global COVID-19 pandemic, which necessitated consideration that the patient might be at risk for infection with the SARS-CoV-2 virus that causes COVID-19. Institutional protocols and algorithms that pertain to the evaluation of patients at risk for COVID-19 are in a state of rapid change based on information released by regulatory bodies including the CDC and federal and state organizations. These policies and algorithms were followed during the patient's care in the ED.  As part of my medical decision making, I reviewed the following data within the [place] notes reviewed and incorporated, Labs reviewed, notes from prior ED visits and [place] Controlled Substance Database   ____________________________________________   FINAL CLINICAL IMPRESSION(S) / ED DIAGNOSES  Final diagnoses:  Non-intractable vomiting with nausea, unspecified vomiting type  Dehydration      NEW MEDICATIONS STARTED DURING  THIS VISIT:  New Prescriptions   DOXYCYCLINE (VIBRA-TABS) 100 MG TABLET    Take 1 tablet (100 mg total) by mouth 2 (two) times daily for 7 days.   PROMETHAZINE (PHENERGAN) 12.5 MG TABLET    Take 1 tablet (12.5 mg total) by mouth every 6 (six) hours as needed.   SACCHAROMYCES BOULARDII (PROBIOTIC) 250 MG CAPS    Take 1 capsule by mouth 3 times/day as needed-between meals & bedtime for up to 10 days.     Note:  This document was  prepared using Dragon voice recognition software and may include unintentional dictation errors.    Merlyn Lot, MD 03/24/20 [phone]

## 2020-03-24 NOTE — Discharge Instructions (Signed)

## 2020-03-24 NOTE — Telephone Encounter (Signed)
Agree with ER eval.

## 2020-03-24 NOTE — Progress Notes (Signed)
Inpatient Diabetes Program Recommendations  AACE/ADA: New Consensus Statement on Inpatient Glycemic Control (2015)  Target Ranges:  Prepandial:   less than 140 mg/dL      Peak postprandial:   less than 180 mg/dL (1-2 hours)      Critically ill patients:  140 - 180 mg/dL   Lab Results  Component Value Date   GLUCAP 241 (H) 03/24/2020   HGBA1C 9.6 (H) 09/05/2017    Review of Glycemic Control  Diabetes history: DM1 Outpatient Diabetes medications: Medtronic 670 G insulin pump Current orders for Inpatient glycemic control:Insulin pump  Inpatient Diabetes Program Recommendations:   Patient sees Dr. Honor Junes as her endocrinologist. Per office note:  "She is on the 52 G pump/sensor system. Manual mode basal rates Midnight = 0.7 5 AM = 0.775 3 PM = 0.6 TDD basal: 16.65  Bolus settings I/C: 12 ISF: 40 Target Glucose: 100-120 Active insulin time: 3 hours  Total Daily dose approximately 40 units."  Spoke with patient regarding elevated blood glucose @ home and after coming to the hospital. Patient states she has not been feeling well and vomited 16 times yesterday and then her CBG elevated. Patient changed her insulin pump insertion site @ this time and came to the emergency room today. Patient plans to have her husband bring supplies for her insulin pump from home. Reviewed with patient if her CBGs are not well controlled on her insulin pump, can transition to subcutaneous insulin.  Thank you, Karen Byrd. Karen Reister, RN, MSN, CDE  Diabetes Coordinator Inpatient Glycemic Control Team Team Pager [phone] (8am-5pm) 03/24/2020 1:59 PM

## 2020-03-24 NOTE — ED Notes (Signed)
Pt reports yesterday around 11:30 she started vomiting and now has fever, chills and bodyaches. Pt last vomited at 3:30 this am.

## 2020-03-24 NOTE — Telephone Encounter (Signed)
Pt said since 03/22/20 at 11:30 AM pt has been vomiting and unable to keep anything down. Pt is feeling hot to cold; has not gotten out of bed to take temp. Pt has body aches all over, H/A with pain level 6-7, pt is extremely tired, pt has on and off dull abd pain at waist line with pain level now of 7. Pt has dry mouth,dizziness,pt voided 3 hr ago with small amt of urine but normal color. Pt FBS this morning is 411 and pt has insulin pump; pt has not contacted endo. Pt is concerned about dehydration. Pt's husband is taking her to [facility] ED now. FYI to Avie Echevaria NP.

## 2020-03-24 NOTE — ED Notes (Signed)
Pt given cup to supply urine sample when able

## 2020-03-24 NOTE — ED Triage Notes (Signed)
PT IS type 1 on insulin pump who c/o LUQ pain with N/V since yesterday. CBG this morning 411

## 2020-03-24 NOTE — ED Notes (Signed)
Pt given water 

## 2020-04-06 ENCOUNTER — Other Ambulatory Visit: Payer: Self-pay

## 2020-04-06 ENCOUNTER — Encounter: Payer: 59 | Admitting: Internal Medicine

## 2020-04-06 ENCOUNTER — Encounter: Payer: Self-pay | Admitting: Internal Medicine

## 2020-04-06 ENCOUNTER — Ambulatory Visit (INDEPENDENT_AMBULATORY_CARE_PROVIDER_SITE_OTHER): Payer: 59 | Admitting: Internal Medicine

## 2020-04-06 VITALS — BP 112/74 | HR 77 | Temp 98.1°F | Ht 70.5 in | Wt 206.0 lb

## 2020-04-06 DIAGNOSIS — F419 Anxiety disorder, unspecified: Secondary | ICD-10-CM

## 2020-04-06 DIAGNOSIS — K219 Gastro-esophageal reflux disease without esophagitis: Secondary | ICD-10-CM

## 2020-04-06 DIAGNOSIS — K581 Irritable bowel syndrome with constipation: Secondary | ICD-10-CM

## 2020-04-06 DIAGNOSIS — Z0000 Encounter for general adult medical examination without abnormal findings: Secondary | ICD-10-CM | POA: Diagnosis not present

## 2020-04-06 DIAGNOSIS — F329 Major depressive disorder, single episode, unspecified: Secondary | ICD-10-CM

## 2020-04-06 DIAGNOSIS — E1065 Type 1 diabetes mellitus with hyperglycemia: Secondary | ICD-10-CM | POA: Diagnosis not present

## 2020-04-06 DIAGNOSIS — F32A Depression, unspecified: Secondary | ICD-10-CM

## 2020-04-06 MED ORDER — SERTRALINE HCL 50 MG PO TABS: 50.0000 mg | ORAL_TABLET | Freq: Every day | ORAL | 3 refills | Status: DC

## 2020-04-06 MED ORDER — LINACLOTIDE 290 MCG PO CAPS: ORAL_CAPSULE | ORAL | 3 refills | Status: DC

## 2020-04-06 MED ORDER — INSULIN LISPRO 100 UNIT/ML ~~LOC~~ SOLN: SUBCUTANEOUS | 5 refills | Status: DC

## 2020-04-06 NOTE — Progress Notes (Signed)
Subjective:    Patient ID: Karen Byrd, female    DOB: 02/28/1981, 39 y.o.   MRN: [id number]  HPI  Patient presents the clinic today for her annual exam.  She is also due to follow-up chronic conditions.  DM 1: Her last A1c was 9.6%.  Her sugars range 260- 414.  She is taking NovoLog as prescribed.  She checks her feet daily.  She does not follow with endocrinology.  GERD: She denies recent issues off Pantoprazole.  Upper GI from 04/2018 reviewed.  Chronic Constipation: Controlled with fiber and Linzess.  Colonoscopy from 04/2018 reviewed.  Anxiety and Depression: Persistent.  She is taking Sertraline as prescribed.  She is not currently seeing a therapist.  She denies SI/HI.  Flu: 07/2019 Tetanus: 09/2018 Covid: 12/2019, 01/2020 Pneumovax: never Pap Smear: 2015 Mammogram: 2015 Vision Screening: annually Dentist: biannually  Diet: She does eat meat. She consume fruits and veggies daily. She tries to avoid fried foods. She drinks mostly water, Dt. Ginger Ale, coffee Exercise: Walking  Review of Systems      Past Medical History:  Diagnosis Date  . Constipation   . Depression   . Diabetes mellitus   . Diabetes mellitus without complication ([place])   . Dysphagia   . VSD (ventricular septal defect)     Current Outpatient Medications  Medication Sig Dispense Refill  . ACZONE 7.5 % GEL Apply 1 application topically daily. 60 g 3  . insulin lispro (HUMALOG) 100 UNIT/ML injection Use up to 70 units daily as directed via insulin pump    . linaclotide (LINZESS) 290 MCG CAPS capsule TAKE 1 CAPSULE (290 MCG TOTAL) BY MOUTH ONCE DAILY    . PARAGARD INTRAUTERINE COPPER IU 1 Device by Intrauterine route. Every 10 years--inserted 2014    . promethazine (PHENERGAN) 12.5 MG tablet Take 1 tablet (12.5 mg total) by mouth every 6 (six) hours as needed. 12 tablet 0  . sertraline (ZOLOFT) 50 MG tablet TAKE 1 TABLET (50 MG TOTAL) BY MOUTH DAILY. MUST SCHEDULE ANNUAL EXAM 90 tablet 0   No  current facility-administered medications for this visit.    Allergies  Allergen Reactions  . Sulfa Antibiotics Hives    "massive hives"    Family History  Problem Relation Age of Onset  . Hypertension Mother   . Diabetes Mother   . Hyperlipidemia Father   . Hypertension Father   . Heart disease Maternal Grandfather   . Diabetes Maternal Grandfather   . Stroke Maternal Grandfather   . Hyperlipidemia Paternal Grandmother   . Heart disease Paternal Grandmother   . Hypertension Paternal Grandmother   . Diabetes Paternal Grandmother   . Stroke Paternal Grandmother   . Hyperlipidemia Paternal Grandfather   . Hypertension Paternal Grandfather   . Stroke Paternal Grandfather   . Cancer Maternal Grandmother        leukemia  . Anesthesia problems Neg Hx   . Hypotension Neg Hx   . Malignant hyperthermia Neg Hx   . Pseudochol deficiency Neg Hx     Social History   Socioeconomic History  . Marital status: Married    Spouse name: Not on file  . Number of children: Not on file  . Years of education: Not on file  . Highest education level: Not on file  Occupational History  . Not on file  Tobacco Use  . Smoking status: Never Smoker  . Smokeless tobacco: Never Used  Vaping Use  . Vaping Use: Never used  Substance and  Sexual Activity  . Alcohol use: Yes    Comment: occasional none last 24hrs  . Drug use: No  . Sexual activity: Yes    Birth control/protection: I.U.D.  Other Topics Concern  . Not on file  Social History Narrative   ** Merged History Encounter **       Social Determinants of Health   Financial Resource Strain:   . Difficulty of Paying Living Expenses:   Food Insecurity:   . Worried About Charity fundraiser in the Last Year:   . Arboriculturist in the Last Year:   Transportation Needs:   . Film/video editor (Medical):   Marland Kitchen Lack of Transportation (Non-Medical):   Physical Activity:   . Days of Exercise per Week:   . Minutes of Exercise per  Session:   Stress:   . Feeling of Stress :   Social Connections:   . Frequency of Communication with Friends and Family:   . Frequency of Social Gatherings with Friends and Family:   . Attends Religious Services:   . Active Member of Clubs or Organizations:   . Attends Archivist Meetings:   Marland Kitchen Marital Status:   Intimate Partner Violence:   . Fear of Current or Ex-Partner:   . Emotionally Abused:   Marland Kitchen Physically Abused:   . Sexually Abused:      Constitutional: Pt reports fatigue. Denies fever, malaise, headache or abrupt weight changes.  HEENT: Denies eye pain, eye redness, ear pain, ringing in the ears, wax buildup, runny nose, nasal congestion, bloody nose, or sore throat. Respiratory: Denies difficulty breathing, shortness of breath, cough or sputum production.   Cardiovascular: Denies chest pain, chest tightness, palpitations or swelling in the hands or feet.  Gastrointestinal: Denies abdominal pain, bloating, constipation, diarrhea or blood in the stool.  GU: Denies urgency, frequency, pain with urination, burning sensation, blood in urine, odor or discharge. Musculoskeletal: Denies decrease in range of motion, difficulty with gait, muscle pain or joint pain and swelling.  Skin: Denies redness, rashes, lesions or ulcercations.  Neurological: Denies dizziness, difficulty with memory, difficulty with speech or problems with balance and coordination.  Psych: Pt has a history of anxiety and depression. Denies SI/HI.  No other specific complaints in a complete review of systems (except as listed in HPI above).  Objective:   Physical Exam   BP 112/74   Pulse 77   Temp 98.1 F (36.7 C) (Temporal)   Ht 5' 10.5" (1.791 m)   Wt 206 lb (93.4 kg)   SpO2 98%   BMI 29.14 kg/m   Wt Readings from Last 3 Encounters:  03/24/20 200 lb (90.7 kg)  12/29/19 221 lb (100.2 kg)  08/03/19 214 lb (97.1 kg)    General: Appears her stated age, well developed, well nourished in  NAD. Skin: Warm, dry and intact. No  ulcerations noted. HEENT: Head: normal shape and size; Eyes: sclera white, no icterus, conjunctiva pink, PERRLA and EOMs intact;  Neck:  Neck supple, trachea midline. No masses, lumps or thyromegaly present.  Cardiovascular: Normal rate and rhythm. S1,S2 noted.  No murmur, rubs or gallops noted. No JVD or BLE edema. Pulmonary/Chest: Normal effort and positive vesicular breath sounds. No respiratory distress. No wheezes, rales or ronchi noted.  Abdomen: Soft and nontender. Normal bowel sounds. No distention or masses noted. Liver, spleen and kidneys non palpable. Musculoskeletal: Strength 5/5 BUE/BLE. No difficulty with gait.  Neurological: Alert and oriented. Cranial nerves II-XII grossly intact. Coordination normal.  Psychiatric: Mood and affect normal. Behavior is normal. Judgment and thought content normal.     BMET    Component Value Date/Time   NA 130 (L) 03/24/2020 1030   K 3.5 03/24/2020 1030   CL 95 (L) 03/24/2020 1030   CO2 25 03/24/2020 1030   GLUCOSE 250 (H) 03/24/2020 1030   BUN 13 03/24/2020 1030   CREATININE 0.73 03/24/2020 1030   CALCIUM 8.7 (L) 03/24/2020 1030   GFRNONAA >60 03/24/2020 1030   GFRAA >60 03/24/2020 1030    Lipid Panel     Component Value Date/Time   CHOL 229 (H) 09/05/2017 0918   TRIG 61.0 09/05/2017 0918   HDL 74.20 09/05/2017 0918   CHOLHDL 3 09/05/2017 0918   VLDL 12.2 09/05/2017 0918   LDLCALC 142 (H) 09/05/2017 0918    CBC    Component Value Date/Time   WBC 11.4 (H) 03/24/2020 1030   RBC 4.10 03/24/2020 1030   HGB 12.9 03/24/2020 1030   HCT 37.5 03/24/2020 1030   PLT 218 03/24/2020 1030   MCV 91.5 03/24/2020 1030   MCH 31.5 03/24/2020 1030   MCHC 34.4 03/24/2020 1030   RDW 12.8 03/24/2020 1030    Hgb A1C Lab Results  Component Value Date   HGBA1C 9.6 (H) 09/05/2017           Assessment & Plan:   Preventative Health Maintenance:  Encouraged her to get a flu shot in the  fall Tetanus UTD She declines pneumovax today Covid UTD She declines pap smear today, on menses, will reschedule Will start screening mammograms at 6 Encouraged her to consume a balanced diet and exercise regimen Advised her to see an eye doctor and dentist annually Will check CBC, CMET, Lipid, A1C, urine microalbumin and Vit D today  RTC in 3 months, follow up DM 1 Webb Silversmith, NP This visit occurred during the SARS-CoV-2 public health emergency.  Safety protocols were in place, including screening questions prior to the visit, additional usage of staff PPE, and extensive cleaning of exam room while observing appropriate contact time as indicated for disinfecting solutions.

## 2020-04-06 NOTE — Assessment & Plan Note (Signed)
Linzess refilled today We will monitor

## 2020-04-06 NOTE — Assessment & Plan Note (Signed)
CBC, C met, lipid, A1c and urine microalbumin today NovoLog refilled Encouraged her to consume a low carb diet and exercise for weight loss Refilled [employer] Encouraged yearly eye exam Encourage routine foot exam Encouraged her to get a flu shot in the fall Covid UTD She declines Pneumovax today

## 2020-04-06 NOTE — Patient Instructions (Signed)
Health Maintenance, Female Adopting a healthy lifestyle and getting preventive care are important in promoting health and wellness. Ask your health care provider about:  The right schedule for you to have regular tests and exams.  Things you can do on your own to prevent diseases and keep yourself healthy. What should I know about diet, weight, and exercise? Eat a healthy diet   Eat a diet that includes plenty of vegetables, fruits, low-fat dairy products, and lean protein.  Do not eat a lot of foods that are high in solid fats, added sugars, or sodium. Maintain a healthy weight Body mass index (BMI) is used to identify weight problems. It estimates body fat based on height and weight. Your health care provider can help determine your BMI and help you achieve or maintain a healthy weight. Get regular exercise Get regular exercise. This is one of the most important things you can do for your health. Most adults should:  Exercise for at least 150 minutes each week. The exercise should increase your heart rate and make you sweat (moderate-intensity exercise).  Do strengthening exercises at least twice a week. This is in addition to the moderate-intensity exercise.  Spend less time sitting. Even light physical activity can be beneficial. Watch cholesterol and blood lipids Have your blood tested for lipids and cholesterol at 40 years of age, then have this test every 5 years. Have your cholesterol levels checked more often if:  Your lipid or cholesterol levels are high.  You are older than 40 years of age.  You are at high risk for heart disease. What should I know about cancer screening? Depending on your health history and family history, you may need to have cancer screening at various ages. This may include screening for:  Breast cancer.  Cervical cancer.  Colorectal cancer.  Skin cancer.  Lung cancer. What should I know about heart disease, diabetes, and high blood  pressure? Blood pressure and heart disease  High blood pressure causes heart disease and increases the risk of stroke. This is more likely to develop in people who have high blood pressure readings, are of African descent, or are overweight.  Have your blood pressure checked: ? Every 3-5 years if you are 18-39 years of age. ? Every year if you are 40 years old or older. Diabetes Have regular diabetes screenings. This checks your fasting blood sugar level. Have the screening done:  Once every three years after age 40 if you are at a normal weight and have a low risk for diabetes.  More often and at a younger age if you are overweight or have a high risk for diabetes. What should I know about preventing infection? Hepatitis B If you have a higher risk for hepatitis B, you should be screened for this virus. Talk with your health care provider to find out if you are at risk for hepatitis B infection. Hepatitis C Testing is recommended for:  Everyone born from 1945 through 1965.  Anyone with known risk factors for hepatitis C. Sexually transmitted infections (STIs)  Get screened for STIs, including gonorrhea and chlamydia, if: ? You are sexually active and are younger than 40 years of age. ? You are older than 40 years of age and your health care provider tells you that you are at risk for this type of infection. ? Your sexual activity has changed since you were last screened, and you are at increased risk for chlamydia or gonorrhea. Ask your health care provider if   you are at risk.  Ask your health care provider about whether you are at high risk for HIV. Your health care provider may recommend a prescription medicine to help prevent HIV infection. If you choose to take medicine to prevent HIV, you should first get tested for HIV. You should then be tested every 3 months for as long as you are taking the medicine. Pregnancy  If you are about to stop having your period (premenopausal) and  you may become pregnant, seek counseling before you get pregnant.  Take 400 to 800 micrograms (mcg) of folic acid every day if you become pregnant.  Ask for birth control (contraception) if you want to prevent pregnancy. Osteoporosis and menopause Osteoporosis is a disease in which the bones lose minerals and strength with aging. This can result in bone fractures. If you are 65 years old or older, or if you are at risk for osteoporosis and fractures, ask your health care provider if you should:  Be screened for bone loss.  Take a calcium or vitamin D supplement to lower your risk of fractures.  Be given hormone replacement therapy (HRT) to treat symptoms of menopause. Follow these instructions at home: Lifestyle  Do not use any products that contain nicotine or tobacco, such as cigarettes, e-cigarettes, and chewing tobacco. If you need help quitting, ask your health care provider.  Do not use street drugs.  Do not share needles.  Ask your health care provider for help if you need support or information about quitting drugs. Alcohol use  Do not drink alcohol if: ? Your health care provider tells you not to drink. ? You are pregnant, may be pregnant, or are planning to become pregnant.  If you drink alcohol: ? Limit how much you use to 0-1 drink a day. ? Limit intake if you are breastfeeding.  Be aware of how much alcohol is in your drink. In the [place] one drink equals one 12 oz bottle of beer (355 mL), one 5 oz glass of wine (148 mL), or one 1 oz glass of hard liquor (44 mL). General instructions  Schedule regular health, dental, and eye exams.  Stay current with your vaccines.  Tell your health care provider if: ? You often feel depressed. ? You have ever been abused or do not feel safe at home. Summary  Adopting a healthy lifestyle and getting preventive care are important in promoting health and wellness.  Follow your health care provider's instructions about healthy  diet, exercising, and getting tested or screened for diseases.  Follow your health care provider's instructions on monitoring your cholesterol and blood pressure. This information is not intended to replace advice given to you by your health care provider. Make sure you discuss any questions you have with your health care provider. Document Revised: 10/01/2018 Document Reviewed: 10/01/2018 Elsevier Patient Education  2020 Elsevier Inc.  

## 2020-04-06 NOTE — Assessment & Plan Note (Signed)
Sertraline refilled today We will monitor

## 2020-04-06 NOTE — Assessment & Plan Note (Signed)
Currently not an issue °We will monitor °

## 2020-04-07 LAB — CBC
HCT: 39.7 % (ref 36.0–46.0)
Hemoglobin: 13.3 g/dL (ref 12.0–15.0)
MCHC: 33.5 g/dL (ref 30.0–36.0)
MCV: 93.1 fl (ref 78.0–100.0)
Platelets: 408 10*3/uL — ABNORMAL HIGH (ref 150.0–400.0)
RBC: 4.26 Mil/uL (ref 3.87–5.11)
RDW: 13.7 % (ref 11.5–15.5)
WBC: 6.2 10*3/uL (ref 4.0–10.5)

## 2020-04-07 LAB — VITAMIN D 25 HYDROXY (VIT D DEFICIENCY, FRACTURES): VITD: 19.54 ng/mL — ABNORMAL LOW (ref 30.00–100.00)

## 2020-04-07 LAB — COMPREHENSIVE METABOLIC PANEL
ALT: 12 U/L (ref 0–35)
AST: 17 U/L (ref 0–37)
Albumin: 4 g/dL (ref 3.5–5.2)
Alkaline Phosphatase: 75 U/L (ref 39–117)
BUN: 12 mg/dL (ref 6–23)
CO2: 25 mEq/L (ref 19–32)
Calcium: 9.3 mg/dL (ref 8.4–10.5)
Chloride: 103 mEq/L (ref 96–112)
Creatinine, Ser: 0.63 mg/dL (ref 0.40–1.20)
GFR: 104.46 mL/min (ref >60.00)
Glucose, Bld: 147 mg/dL — ABNORMAL HIGH (ref 70–99)
Potassium: 3.9 mEq/L (ref 3.5–5.1)
Sodium: 138 mEq/L (ref 135–145)
Total Bilirubin: 1 mg/dL (ref 0.2–1.2)
Total Protein: 6.8 g/dL (ref 6.0–8.3)

## 2020-04-07 LAB — MICROALBUMIN / CREATININE URINE RATIO
Creatinine,U: 170 mg/dL
Microalb Creat Ratio: 0.6 mg/g (ref 0.0–30.0)
Microalb, Ur: 1.1 mg/dL (ref 0.0–1.9)

## 2020-04-07 LAB — LIPID PANEL
Cholesterol: 227 mg/dL — ABNORMAL HIGH (ref 0–200)
HDL: 64.1 mg/dL (ref >39.00)
LDL Cholesterol: 130 mg/dL — ABNORMAL HIGH (ref 0–99)
NonHDL: 162.46
Total CHOL/HDL Ratio: 4
Triglycerides: 161 mg/dL — ABNORMAL HIGH (ref 0.0–149.0)
VLDL: 32.2 mg/dL (ref 0.0–40.0)

## 2020-04-07 LAB — HEMOGLOBIN A1C: Hgb A1c MFr Bld: 9.1 % — ABNORMAL HIGH (ref 4.6–6.5)

## 2020-04-12 ENCOUNTER — Encounter: Payer: Self-pay | Admitting: Internal Medicine

## 2020-04-12 DIAGNOSIS — E559 Vitamin D deficiency, unspecified: Secondary | ICD-10-CM

## 2020-04-12 MED ORDER — VITAMIN D (ERGOCALCIFEROL) 1.25 MG (50000 UNIT) PO CAPS: 50000.0000 [IU] | ORAL_CAPSULE | ORAL | 0 refills | Status: DC

## 2020-04-18 ENCOUNTER — Encounter: Payer: 59 | Admitting: Internal Medicine

## 2020-05-07 ENCOUNTER — Encounter: Payer: Self-pay | Admitting: Internal Medicine

## 2020-05-31 ENCOUNTER — Encounter: Payer: Self-pay | Admitting: Internal Medicine

## 2020-10-24 ENCOUNTER — Encounter: Payer: Self-pay | Admitting: Internal Medicine

## 2020-10-24 NOTE — Telephone Encounter (Signed)
Yes, have her schedule a virtual, I will work her in tomorrow.

## 2020-10-25 ENCOUNTER — Telehealth (INDEPENDENT_AMBULATORY_CARE_PROVIDER_SITE_OTHER): Payer: 59 | Admitting: Internal Medicine

## 2020-10-25 ENCOUNTER — Other Ambulatory Visit: Payer: Self-pay

## 2020-10-25 DIAGNOSIS — R0781 Pleurodynia: Secondary | ICD-10-CM

## 2020-10-25 MED ORDER — CYCLOBENZAPRINE HCL 10 MG PO TABS
10.0000 mg | ORAL_TABLET | Freq: Three times a day (TID) | ORAL | 0 refills | Status: DC | PRN
Start: 2020-10-25 — End: 2021-09-07

## 2020-10-25 NOTE — Progress Notes (Signed)
Virtual Visit via Video Note  I connected with Karen Byrd on 10/25/20 at  2:45 PM EST by a video enabled telemedicine application and verified that I am speaking with the correct person using two identifiers.  Location: Patient: Home Provider: Office   Person's participating in this video call: Webb Silversmith, NP-C and Huel Cote.  I discussed the limitations of evaluation and management by telemedicine and the availability of in person appointments. The patient expressed understanding and agreed to proceed.  History of Present Illness:  Pt reports left side rib pain after falling while skiing in [place] 1 week ago. She reports a ski pole hit her in her rib cage. She describes the pain as soreness. The pain is worse with movement. She has tried Ibuprofen OTC with minimal relief of symptoms.     Past Medical History:  Diagnosis Date  . Constipation   . Depression   . Diabetes mellitus   . Diabetes mellitus without complication ([place])   . Dysphagia   . VSD (ventricular septal defect)     Current Outpatient Medications  Medication Sig Dispense Refill  . ACZONE 7.5 % GEL Apply 1 application topically daily. 60 g 3  . cyclobenzaprine (FLEXERIL) 10 MG tablet Take 1 tablet (10 mg total) by mouth 3 (three) times daily as needed for muscle spasms. 30 tablet 0  . insulin lispro (HUMALOG) 100 UNIT/ML injection Use up to 70 units daily as directed via insulin pump 10 mL 5  . linaclotide (LINZESS) 290 MCG CAPS capsule TAKE 1 CAPSULE (290 MCG TOTAL) BY MOUTH ONCE DAILY 90 capsule 3  . PARAGARD INTRAUTERINE COPPER IU 1 Device by Intrauterine route. Every 10 years--inserted 2014    . promethazine (PHENERGAN) 12.5 MG tablet Take 1 tablet (12.5 mg total) by mouth every 6 (six) hours as needed. 12 tablet 0  . sertraline (ZOLOFT) 50 MG tablet Take 1 tablet (50 mg total) by mouth daily. 90 tablet 3   No current facility-administered medications for this visit.    Allergies  Allergen Reactions   . Sulfa Antibiotics Hives    "massive hives"    Family History  Problem Relation Age of Onset  . Hypertension Mother   . Diabetes Mother   . Hyperlipidemia Father   . Hypertension Father   . Heart disease Maternal Grandfather   . Diabetes Maternal Grandfather   . Stroke Maternal Grandfather   . Hyperlipidemia Paternal Grandmother   . Heart disease Paternal Grandmother   . Hypertension Paternal Grandmother   . Diabetes Paternal Grandmother   . Stroke Paternal Grandmother   . Hyperlipidemia Paternal Grandfather   . Hypertension Paternal Grandfather   . Stroke Paternal Grandfather   . Cancer Maternal Grandmother        leukemia  . Anesthesia problems Neg Hx   . Hypotension Neg Hx   . Malignant hyperthermia Neg Hx   . Pseudochol deficiency Neg Hx     Social History   Socioeconomic History  . Marital status: Married    Spouse name: Not on file  . Number of children: Not on file  . Years of education: Not on file  . Highest education level: Not on file  Occupational History  . Not on file  Tobacco Use  . Smoking status: Never Smoker  . Smokeless tobacco: Never Used  Vaping Use  . Vaping Use: Never used  Substance and Sexual Activity  . Alcohol use: Yes    Comment: occasional none last 24hrs  . Drug  use: No  . Sexual activity: Yes    Birth control/protection: I.U.D.  Other Topics Concern  . Not on file  Social History Narrative   ** Merged History Encounter **       Social Determinants of Health   Financial Resource Strain: Not on file  Food Insecurity: Not on file  Transportation Needs: Not on file  Physical Activity: Not on file  Stress: Not on file  Social Connections: Not on file  Intimate Partner Violence: Not on file     Constitutional: Denies fever, malaise, fatigue, headache or abrupt weight changes.  Respiratory: Denies difficulty breathing, shortness of breath, cough or sputum production.   Cardiovascular: Denies chest pain, chest tightness,  palpitations or swelling in the hands or feet.  Musculoskeletal: Pt reports left side rib pain. Denies decrease in range of motion, difficulty with gait, or joint swelling.  Skin: Denies redness, rashes, lesions or ulcercations.    No other specific complaints in a complete review of systems (except as listed in HPI above).  Observations/Objective:  Wt Readings from Last 3 Encounters:  04/06/20 206 lb (93.4 kg)  03/24/20 200 lb (90.7 kg)  12/29/19 221 lb (100.2 kg)    General: Appears her stated age, well developed, well nourished in NAD. Pulmonary/Chest: Normal effort. Neurological: Alert and oriented.     BMET    Component Value Date/Time   NA 138 04/06/2020 1542   K 3.9 04/06/2020 1542   CL 103 04/06/2020 1542   CO2 25 04/06/2020 1542   GLUCOSE 147 (H) 04/06/2020 1542   BUN 12 04/06/2020 1542   CREATININE 0.63 04/06/2020 1542   CALCIUM 9.3 04/06/2020 1542   GFRNONAA >60 03/24/2020 1030   GFRAA >60 03/24/2020 1030    Lipid Panel     Component Value Date/Time   CHOL 227 (H) 04/06/2020 1542   TRIG 161.0 (H) 04/06/2020 1542   HDL 64.10 04/06/2020 1542   CHOLHDL 4 04/06/2020 1542   VLDL 32.2 04/06/2020 1542   LDLCALC 130 (H) 04/06/2020 1542    CBC    Component Value Date/Time   WBC 6.2 04/06/2020 1542   RBC 4.26 04/06/2020 1542   HGB 13.3 04/06/2020 1542   HCT 39.7 04/06/2020 1542   PLT 408.0 (H) 04/06/2020 1542   MCV 93.1 04/06/2020 1542   MCH 31.5 03/24/2020 1030   MCHC 33.5 04/06/2020 1542   RDW 13.7 04/06/2020 1542    Hgb A1C Lab Results  Component Value Date   HGBA1C 9.1 (H) 04/06/2020        Assessment and Plan:  Left Side Rib Pain s/p Skiing Accident:  Continue Ibuprofen OTC RX for Flexeril 10 mg TID prn- take at night only if working during the day Encouraged alternating heat and ice Chest xray will not change management at this time  Return precautions discussed Follow Up Instructions:    I discussed the assessment and  treatment plan with the patient. The patient was provided an opportunity to ask questions and all were answered. The patient agreed with the plan and demonstrated an understanding of the instructions.   The patient was advised to call back or seek an in-person evaluation if the symptoms worsen or if the condition fails to improve as anticipated.   Nicki Reaper, NP

## 2020-11-08 NOTE — Progress Notes (Deleted)
    Karen Niese T. Ibrahim Mcpheeters, MD, [place]  [facility] [place], [address]  Phone: [phone]  FAX: [phone]  Karen Byrd - 40 y.o. female  MRN [id number]  Date of Birth: 02-Feb-1981  Date: 11/09/2020  PCP: Jearld Fenton, NP  Referral: Jearld Fenton, NP  No chief complaint on file.   This visit occurred during the SARS-CoV-2 public health emergency.  Safety protocols were in place, including screening questions prior to the visit, additional usage of staff PPE, and extensive cleaning of exam room while observing appropriate contact time as indicated for disinfecting solutions.   Subjective:   Karen Byrd is a 40 y.o. very pleasant female patient with There is no height or weight on file to calculate BMI. who presents with the following:  Karen Byrd is here for rib pain on the left side after a skiing accident.  She initially saw my partner Mrs. Baity on October 25, 2020.  She was given some ibuprofen, Flexeril and expected management.    Review of Systems is noted in the HPI, as appropriate   Objective:   There were no vitals taken for this visit.  ***  Radiology: No results found.  Assessment and Plan:   ***

## 2020-11-09 ENCOUNTER — Ambulatory Visit: Payer: 59 | Admitting: Family Medicine

## 2020-11-14 ENCOUNTER — Ambulatory Visit: Payer: 59 | Admitting: Family Medicine

## 2020-11-14 DIAGNOSIS — Z0289 Encounter for other administrative examinations: Secondary | ICD-10-CM

## 2020-12-22 ENCOUNTER — Other Ambulatory Visit: Payer: Self-pay | Admitting: Internal Medicine

## 2020-12-29 ENCOUNTER — Ambulatory Visit: Payer: 59 | Admitting: Family Medicine

## 2020-12-29 ENCOUNTER — Other Ambulatory Visit: Payer: Self-pay

## 2020-12-29 ENCOUNTER — Encounter: Payer: Self-pay | Admitting: Family Medicine

## 2020-12-29 VITALS — BP 100/60 | HR 83 | Temp 98.0°F | Ht 71.5 in | Wt 220.2 lb

## 2020-12-29 DIAGNOSIS — M542 Cervicalgia: Secondary | ICD-10-CM

## 2020-12-29 DIAGNOSIS — M7501 Adhesive capsulitis of right shoulder: Secondary | ICD-10-CM

## 2020-12-29 DIAGNOSIS — E1065 Type 1 diabetes mellitus with hyperglycemia: Secondary | ICD-10-CM | POA: Diagnosis not present

## 2020-12-29 MED ORDER — TRIAMCINOLONE ACETONIDE 40 MG/ML IJ SUSP
40.0000 mg | Freq: Once | INTRAMUSCULAR | Status: AC
  Administered 2020-12-29: 40 mg via INTRA_ARTICULAR

## 2020-12-29 NOTE — Progress Notes (Signed)
Karen Byrd T. Karen Noblett, MD, [place] [facility] [place], [address]  Phone: [phone]   FAX: [phone]  Karen Byrd - 40 y.o. female   MRN [id number]   Date of Birth: Oct 04, 1981  Date: 12/29/2020   PCP: Karen Fenton, NP   Referral: Karen Fenton, NP  Chief Complaint  Patient presents with   Neck Pain    Base of neck on right side and does hurt to bring shoulder up    This visit occurred during the SARS-CoV-2 public health emergency.  Safety protocols were in place, including screening questions prior to the visit, additional usage of staff PPE, and extensive cleaning of exam room while observing appropriate contact time as indicated for disinfecting solutions.   Subjective:   Karen Byrd is a 40 y.o. very pleasant female patient who presents with the following: shoulder pain  The patient noted above presents with shoulder pain that has been ongoing for around a month. there is no history of trauma or accident. The patient denies neck pain or radicular symptoms. No shoulder blade pain Denies dislocation, subluxation, separation of the shoulder. The patient does complain of pain with flexion, abduction, and terminal motion.  Significant restriction of motion. she describes a deep ache around the shoulder, and sometimes it will wake the patient up at night.  She is having some significant pain from the occiput down through the upper trap.  This is on the right side predominantly.  She also has some deep shoulder pain and pain in and about the scapula and upper extremities. She has no known specific injury  She works essentially all the time, and she is up on her feet and working approximately 90 hours/week. On feet - 90 hours a week. No major recent. Came up out of the blue. Nothing.   NSAIDS, muscle relaxer -this is not helped at all.  Medications Tried: OTC  NSAIDs Ice or Heat: minimal help Tried PT: No  Prior shoulder Injury: No Prior surgery: No Prior fracture: No   Review of Systems is noted in the HPI, as appropriate  Objective:   Blood pressure 100/60, pulse 83, temperature 98 F (36.7 C), temperature source Temporal, height 5' 11.5" (1.816 m), weight 220 lb 4 oz (99.9 kg), SpO2 98 %.   Shoulder: R and L Inspection: No muscle wasting or winging Ecchymosis/edema: neg  AC joint, scapula, clavicle: NT Cervical spine: She has a terminal motion tenderness predominantly in the flexion.  She has loss of approximately 1515% roughly with slightly worsened forward flexion. Spurling's: neg ABNORMAL SIDE TESTED: Right UNLESS OTHERWISE NOTED, THE CONTRALATERAL SIDE HAS FULL RANGE OF MOTION. Abduction: 5/5, LIMITED TO 145 DEGREES Flexion: 5/5, LIMITED TO 145 DEGNO ROM  IR, lift-off: 5/5. TESTED AT 90 DEGREES OF ABDUCTION, LIMITED TO 7 DEGREES ER at neutral:  5/5, TESTED AT 90 DEGREES OF ABDUCTION, LIMITED TO 40 DEGREES AC crossover and compression: PAIN Drop Test: neg Empty Can: neg Supraspinatus insertion: NT Bicipital groove: NT ALL OTHER SPECIAL TESTING EQUIVOCAL GIVEN LOSS OF MOTION C5-T1 intact Sensation intact Grip 5/5   Assessment and Plan:     ICD-10-CM   1. Adhesive capsulitis of right shoulder  M75.01 triamcinolone acetonide (KENALOG-40) injection 40 mg  2. Cervicalgia  M54.2   3. Type 1 diabetes mellitus with hyperglycemia (HCC)  E10.65    Classic frozen shoulder with increased risk with type 1 diabetes.  She does have an insulin pump. She also has some significant neck pain and upper trap pain.  This appears to be muscular, but certainly a frozen shoulder can contribute to this as well.  Patient was given a systematic ROM protocol from [employer] to be done daily. Emphasized importance of adherence, help of PT, daily HEP.  The average length of total symptoms is 12-18 months going through 3 different phases in the  freezing and thawing process. There can be a permanent loss of motion in some cases.   Intraarticular shoulder injections discussed with patient, which have good evidence for accelerating the thawing phase.  Diagnostic and therapeutic intra-articular injection today.  She had significant decreased pain post injection, and she still had a mechanical block in terms of movement passively.  Intraarticular Shoulder Aspiration/Injection Procedure Note Karen Byrd 12-05-1980 Date of procedure: 12/29/2020  Procedure: Large Joint Aspiration / Injection of Shoulder, Intraarticular, R Indications: Pain  Procedure Details Verbal consent was obtained from the patient. Risks explained and contrasted with benefits and alternatives. Patient prepped with Chloraprep and Ethyl Chloride used for anesthesia. An intraarticular shoulder injection was performed using the posterior approach; needle placed into joint capsule without difficulty. The patient tolerated the procedure well and had decreased pain post injection. No complications. Injection: 9 cc of Lidocaine 1% and 1 mL Kenalog 40 mg. Needle: 21 gauge, 2 inch  Follow-up: Given her 90-hour week job, I am going to only have her follow-up on a as needed basis if she needs.  Global follow-up would typically be 2 to 3 months.   Signed,  Maud Deed. Karen Elderkin, MD   Outpatient Encounter Medications as of 12/29/2020  Medication Sig   cyclobenzaprine (FLEXERIL) 10 MG tablet Take 1 tablet (10 mg total) by mouth 3 (three) times daily as needed for muscle spasms.   insulin lispro (HUMALOG) 100 UNIT/ML injection USE UP TO 70 UNITS DAILY AS DIRECTED VIAPUMP   linaclotide (LINZESS) 290 MCG CAPS capsule TAKE 1 CAPSULE (290 MCG TOTAL) BY MOUTH ONCE DAILY   PARAGARD INTRAUTERINE COPPER IU 1 Device by Intrauterine route. Every 10 years--inserted 2014   sertraline (ZOLOFT) 50 MG tablet Take 1 tablet (50 mg total) by mouth daily.   [DISCONTINUED] ACZONE 7.5 % GEL  Apply 1 application topically daily.   [DISCONTINUED] promethazine (PHENERGAN) 12.5 MG tablet Take 1 tablet (12.5 mg total) by mouth every 6 (six) hours as needed.   [EXPIRED] triamcinolone acetonide (KENALOG-40) injection 40 mg    No facility-administered encounter medications on file as of 12/29/2020.

## 2021-02-22 ENCOUNTER — Other Ambulatory Visit: Payer: Self-pay | Admitting: Internal Medicine

## 2021-06-12 LAB — HEMOGLOBIN A1C: Hemoglobin A1C: 9

## 2021-06-12 LAB — MICROALBUMIN, URINE: Microalb, Ur: NORMAL

## 2021-08-09 ENCOUNTER — Other Ambulatory Visit: Payer: Self-pay | Admitting: Family

## 2021-08-09 ENCOUNTER — Other Ambulatory Visit: Payer: Self-pay | Admitting: Internal Medicine

## 2021-08-11 NOTE — Telephone Encounter (Signed)
Refill request Zoloft Last refill 02/28/21 #90 Last office visit acute Dr. Lorelei Pont 12/29/20

## 2021-08-19 ENCOUNTER — Other Ambulatory Visit: Payer: Self-pay

## 2021-08-19 ENCOUNTER — Ambulatory Visit (INDEPENDENT_AMBULATORY_CARE_PROVIDER_SITE_OTHER): Payer: 59

## 2021-08-19 ENCOUNTER — Ambulatory Visit
Admission: RE | Admit: 2021-08-19 | Discharge: 2021-08-19 | Disposition: A | Discharge status: Home / Self Care | Payer: 59 | Source: Ambulatory Visit | Attending: Physician Assistant | Admitting: Physician Assistant

## 2021-08-19 VITALS — BP 113/51 | HR 78 | Temp 98.4°F | Resp 16 | Ht 71.0 in | Wt 210.0 lb

## 2021-08-19 DIAGNOSIS — S61431A Puncture wound without foreign body of right hand, initial encounter: Secondary | ICD-10-CM | POA: Diagnosis not present

## 2021-08-19 DIAGNOSIS — M79641 Pain in right hand: Secondary | ICD-10-CM | POA: Diagnosis not present

## 2021-08-19 NOTE — ED Provider Notes (Signed)
[facility]    CSN: [id number] Arrival date & time: 08/19/21  1003      History   Chief Complaint Chief Complaint  Patient presents with   Hand Injury    HPI Karen Byrd is a 40 y.o. female presenting for injury of the right second MCP and index finger.  Patient says about a week ago she was moving a couch and her knuckle hit a brick wall.  She has been icing the area and taking ibuprofen but says pain is about the same and she spoke with her PCP who recommended she go to urgent care for an x-ray.  She denies any associated numbness.  She does have a small wound to the knuckle but says it does not hurt where the wound is.  Skin has closed where the wound is.  She has not had any bleeding or drainage from the area.  No fevers reported.  Patient is right-handed.  She denies any previous fractures or major injury of the right hand.  No other complaints.  HPI  Past Medical History:  Diagnosis Date   Constipation    Depression    Diabetes mellitus    Diabetes mellitus without complication ([place])    Dysphagia    VSD (ventricular septal defect)     Patient Active Problem List   Diagnosis Date Noted   GERD (gastroesophageal reflux disease) 04/06/2020   Type 1 diabetes mellitus with hyperglycemia ([place]) 08/03/2019   Anxiety and depression 09/05/2017   IBS (irritable bowel syndrome) 09/05/2017    Past Surgical History:  Procedure Laterality Date   APPENDECTOMY     CHOLECYSTECTOMY     COLONOSCOPY WITH PROPOFOL N/A 04/29/2018   Procedure: COLONOSCOPY WITH PROPOFOL;  Surgeon: Toledo, Benay Pike, MD;  Location: [facility];  Service: Gastroenterology;  Laterality: N/A;   DILATION AND CURETTAGE OF UTERUS  2014   ESOPHAGOGASTRODUODENOSCOPY (EGD) WITH PROPOFOL N/A 04/29/2018   Procedure: ESOPHAGOGASTRODUODENOSCOPY (EGD) WITH PROPOFOL;  Surgeon: Toledo, Benay Pike, MD;  Location: [facility];  Service: Gastroenterology;  Laterality: N/A;   TONSILLECTOMY     VSD REPAIR   May 2000   VSD REPAIR      OB History     Gravida  2   Para  0   Term  0   Preterm  0   AB  1   Living  0      SAB  1   IAB  0   Ectopic  0   Multiple  0   Live Births               Home Medications    Prior to Admission medications   Medication Sig Start Date End Date Taking? Authorizing Provider  insulin lispro (HUMALOG) 100 UNIT/ML injection USE UP TO 70 UNITS DAILY AS DIRECTED VIAPUMP 12/22/20  Yes Baity, Coralie Keens, NP  linaclotide (LINZESS) 290 MCG CAPS capsule TAKE 1 CAPSULE (290 MCG TOTAL) BY MOUTH ONCE DAILY 04/06/20  Yes Baity, Coralie Keens, NP  PARAGARD INTRAUTERINE COPPER IU 1 Device by Intrauterine route. Every 10 years--inserted 2014   Yes [provider]  sertraline (ZOLOFT) 50 MG tablet TAKE 1 TABLET BY MOUTH DAILY 02/28/21  Yes Dutch Quint B, FNP  cyclobenzaprine (FLEXERIL) 10 MG tablet Take 1 tablet (10 mg total) by mouth 3 (three) times daily as needed for muscle spasms. 10/25/20   Jearld Fenton, NP    Family History Family History  Problem Relation Age of Onset  Hypertension Mother    Diabetes Mother    Hyperlipidemia Father    Hypertension Father    Heart disease Maternal Grandfather    Diabetes Maternal Grandfather    Stroke Maternal Grandfather    Hyperlipidemia Paternal Grandmother    Heart disease Paternal Grandmother    Hypertension Paternal Grandmother    Diabetes Paternal Grandmother    Stroke Paternal Grandmother    Hyperlipidemia Paternal Grandfather    Hypertension Paternal Grandfather    Stroke Paternal Grandfather    Cancer Maternal Grandmother        leukemia   Anesthesia problems Neg Hx    Hypotension Neg Hx    Malignant hyperthermia Neg Hx    Pseudochol deficiency Neg Hx     Social History Social History   Tobacco Use   Smoking status: Never   Smokeless tobacco: Never  Vaping Use   Vaping Use: Never used  Substance Use Topics   Alcohol use: Yes    Comment: occasional none last 24hrs   Drug use:  No     Allergies   Sulfa antibiotics   Review of Systems Review of Systems  Musculoskeletal:  Positive for arthralgias and joint swelling.  Skin:  Positive for wound. Negative for color change.  Neurological:  Negative for weakness and numbness.    Physical Exam Triage Vital Signs ED Triage Vitals  Enc Vitals Group     BP 08/19/21 1029 (!) 113/51     Pulse Rate 08/19/21 1029 78     Resp 08/19/21 1026 16     Temp 08/19/21 1029 98.4 F (36.9 C)     Temp Source 08/19/21 1029 Oral     SpO2 08/19/21 1029 97 %     Weight 08/19/21 1028 210 lb (95.3 kg)     Height 08/19/21 1028 5\' 11"  (1.803 m)     Head Circumference --      Peak Flow --      Pain Score 08/19/21 1024 4     Pain Loc --      Pain Edu? --      Excl. in [place]? --    No data found.  Updated Vital Signs BP (!) 113/51 (BP Location: Left Arm)   Pulse 78   Temp 98.4 F (36.9 C) (Oral)   Resp 16   Ht 5\' 11"  (1.803 m)   Wt 210 lb (95.3 kg)   LMP 07/28/2021 (Approximate)   SpO2 97%   BMI 29.29 kg/m      Physical Exam Vitals and nursing note reviewed.  Constitutional:      General: She is not in acute distress.    Appearance: Normal appearance. She is not ill-appearing or toxic-appearing.  HENT:     Head: Normocephalic and atraumatic.  Eyes:     General: No scleral icterus.       Right eye: No discharge.        Left eye: No discharge.     Conjunctiva/sclera: Conjunctivae normal.  Cardiovascular:     Rate and Rhythm: Normal rate and regular rhythm.     Pulses: Normal pulses.  Pulmonary:     Effort: Pulmonary effort is normal. No respiratory distress.  Musculoskeletal:     Right hand: Swelling (mild swelling 1st MCP) and tenderness (TTP 1st MCP and proximal phalanx right hand) present. No deformity. Decreased range of motion (decreased ROM at first MCP of right hand). Normal capillary refill. Normal pulse.     Cervical back: Neck supple.  Comments: Small puncture wound overlying 1st MCP right hand   Skin:    General: Skin is dry.  Neurological:     General: No focal deficit present.     Mental Status: She is alert. Mental status is at baseline.     Motor: No weakness.     Gait: Gait normal.  Psychiatric:        Mood and Affect: Mood normal.        Behavior: Behavior normal.        Thought Content: Thought content normal.     UC Treatments / Results  Labs (all labs ordered are listed, but only abnormal results are displayed) Labs Reviewed - No data to display  EKG   Radiology DG Hand 2 View Right  Result Date: 08/19/2021 CLINICAL DATA:  Right hand pain EXAM: RIGHT HAND - 2 VIEW COMPARISON:  None. FINDINGS: No acute fracture or dislocation. No aggressive osseous lesion. Normal alignment. 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 Soft tissue are unremarkable. No radiopaque foreign body or soft tissue emphysema. IMPRESSION: No acute osseous injury of the right hand. Electronically Signed   By: Kathreen Devoid M.D.   On: 08/19/2021 11:16    Procedures Procedures (including critical care time)  Medications Ordered in UC Medications - No data to display  Initial Impression / Assessment and Plan / UC Course  I have reviewed the triage vital signs and the nursing notes.  Pertinent labs & imaging results that were available during my care of the patient were reviewed by me and considered in my medical decision making (see chart for details).  40 year old female with history of type 1 diabetes presenting for approximately 1 week history of pain of the right first MCP joint and right first proximal phalanx following an injury where her hand hit a brick wall while carrying a couch.  Has been icing and taking ibuprofen but no improvement in pain.  She also has a healing wound over the first MCP that is small.  X-ray obtained today.  Independent interpretation of x-ray by me is suspicious for small fracture at the base of the first proximal phalanx.  Overread by radiologist does not  report any fractures but I still have concerns especially as this is the area the patient is having pain.  Patient to be placed in radial gutter splint and treated at that this is a fracture.  Advised her to follow-up with Ortho in 1 week if she still having pain in this area or sooner for any worsening of symptoms.  Reviewed RICE guidelines and Tylenol/Motrin for pain relief.  The wound that she has over the first MCP is superficial so I am not concerned about open fracture.  Advised her just to keep it clean but is already been 1 week and appears to be healing well.   Final Clinical Impressions(s) / UC Diagnoses   Final diagnoses:  Right hand pain  Puncture wound of right hand without foreign body, initial encounter     Discharge Instructions      -X-ray interpreted as normal per radiologist, but I still believe there could be a small fracture of the knuckle so we have placed you in a splint and I would like you not to use hand for about a week. If still having the same level of pain in 1 week or if pain worsens you should follow up with ortho for more imaging. See contacts below. -Keep the minor wound clean and dry. Follow  up with [place] if it looks to be infected.  -Tylenol/Motrin for pain, ice, elevation  You may have a condition requiring you to follow up with Orthopedics so please call one of the following office for appointment:   [facility] [address] [place], [place] [address] Phone: [phone]  [facility] [address], [place], [place] [address] Phone: [phone]      ED Prescriptions   None    PDMP not reviewed this encounter.   Danton Clap, PA-C 08/19/21 1140

## 2021-08-19 NOTE — Discharge Instructions (Addendum)
-  X-ray interpreted as normal per radiologist, but I still believe there could be a small fracture of the knuckle so we have placed you in a splint and I would like you not to use hand for about a week. If still having the same level of pain in 1 week or if pain worsens you should follow up with ortho for more imaging. See contacts below. -Keep the minor wound clean and dry. Follow up with [place] if it looks to be infected.  -Tylenol/Motrin for pain, ice, elevation  You may have a condition requiring you to follow up with Orthopedics so please call one of the following office for appointment:   [facility] [address] [place], [place] [address] Phone: [phone]  [facility] [address], [place], [place] [address] Phone: [phone]

## 2021-08-19 NOTE — ED Triage Notes (Signed)
Pt c/o knuckle injury moving a couch last week. Swelling and pain.

## 2021-08-26 ENCOUNTER — Other Ambulatory Visit: Payer: Self-pay | Admitting: Internal Medicine

## 2021-08-26 NOTE — Telephone Encounter (Signed)
Pt of [facility]

## 2021-09-05 NOTE — Telephone Encounter (Signed)
Name of Medication: Humalog 100 units/ml [facility] of [facility]  Last Fill or Written Date and Quantity: # 10 ml x 5 on 12/22/2020 Last Office Visit and Type: 12/29/20 acute neck pain; 04/06/2020 annual Pt saw endocrinologist  Dr Mee Hives 06/06/21 Next Office Visit and Type: 09/07/21 Webb Silversmith NP for med refill at [facility]  Sending request to Avie Echevaria NP

## 2021-09-07 ENCOUNTER — Other Ambulatory Visit: Payer: Self-pay

## 2021-09-07 ENCOUNTER — Encounter: Payer: Self-pay | Admitting: Internal Medicine

## 2021-09-07 ENCOUNTER — Ambulatory Visit (INDEPENDENT_AMBULATORY_CARE_PROVIDER_SITE_OTHER): Payer: 59 | Admitting: Internal Medicine

## 2021-09-07 VITALS — BP 112/59 | HR 83 | Temp 97.7°F | Resp 17 | Ht 71.0 in | Wt 216.2 lb

## 2021-09-07 DIAGNOSIS — K581 Irritable bowel syndrome with constipation: Secondary | ICD-10-CM | POA: Diagnosis not present

## 2021-09-07 DIAGNOSIS — E66811 Obesity, class 1: Secondary | ICD-10-CM | POA: Insufficient documentation

## 2021-09-07 DIAGNOSIS — K219 Gastro-esophageal reflux disease without esophagitis: Secondary | ICD-10-CM

## 2021-09-07 DIAGNOSIS — F419 Anxiety disorder, unspecified: Secondary | ICD-10-CM

## 2021-09-07 DIAGNOSIS — E1065 Type 1 diabetes mellitus with hyperglycemia: Secondary | ICD-10-CM

## 2021-09-07 DIAGNOSIS — E1069 Type 1 diabetes mellitus with other specified complication: Secondary | ICD-10-CM | POA: Insufficient documentation

## 2021-09-07 DIAGNOSIS — E6609 Other obesity due to excess calories: Secondary | ICD-10-CM

## 2021-09-07 DIAGNOSIS — E1059 Type 1 diabetes mellitus with other circulatory complications: Secondary | ICD-10-CM

## 2021-09-07 DIAGNOSIS — E782 Mixed hyperlipidemia: Secondary | ICD-10-CM | POA: Diagnosis not present

## 2021-09-07 DIAGNOSIS — Z6830 Body mass index (BMI) 30.0-30.9, adult: Secondary | ICD-10-CM

## 2021-09-07 DIAGNOSIS — Z23 Encounter for immunization: Secondary | ICD-10-CM | POA: Diagnosis not present

## 2021-09-07 DIAGNOSIS — D75839 Thrombocytosis, unspecified: Secondary | ICD-10-CM

## 2021-09-07 DIAGNOSIS — F32A Depression, unspecified: Secondary | ICD-10-CM

## 2021-09-07 MED ORDER — SERTRALINE HCL 50 MG PO TABS: 50.0000 mg | ORAL_TABLET | Freq: Every day | ORAL | 1 refills | Status: DC

## 2021-09-07 NOTE — Assessment & Plan Note (Signed)
Continue Linzess as needed Encouraged high-fiber diet and adequate water intake

## 2021-09-07 NOTE — Assessment & Plan Note (Signed)
Currently not an issue Will monitor 

## 2021-09-07 NOTE — Progress Notes (Signed)
Subjective:    Patient ID: Karen Byrd, female    DOB: 05/19/1981, 40 y.o.   MRN: [id number]  HPI  Patient presents the clinic today for follow-up of chronic conditions.  IBS: Mainly constipation.  She takes Linzess as needed with good relief of symptoms.  Colonoscopy from 04/2018 reviewed.  GERD: Currently not an issue.  She is not currently taking any medications OTC for this.  Upper GI from 04/2018 reviewed.  DM1: Her last A1c was 9.1, 03/2020.  She has an insulin pump with Humalog.  Her sugars are generally less than 150.  She checks her feet routinely.  Her last eye exam was >1-year ago, is scheduled 11/2021.  Flu 07/2019.  Pneumovax never.  COVID Moderna x 3.  Anxiety and Depression: Chronic, managed on Sertraline. She is not currently seeing a therapist. She denies SI/HI.  HLD: Her last LDL was 130, triglycerides 161, 03/2020. She is not taking any cholesterol lowering medication at this time. She does not consume a low fat diet.  Thrombocytosis: Her last platelet count was 408, 03/2020. She is not taking any antiplatelet therapy at this time. She does not follow with hematology.  Review of Systems     Past Medical History:  Diagnosis Date   Constipation    Depression    Diabetes mellitus    Diabetes mellitus without complication ([place])    Dysphagia    VSD (ventricular septal defect)     Current Outpatient Medications  Medication Sig Dispense Refill   cyclobenzaprine (FLEXERIL) 10 MG tablet Take 1 tablet (10 mg total) by mouth 3 (three) times daily as needed for muscle spasms. 30 tablet 0   HUMALOG 100 UNIT/ML injection USE UP TO 70 UNITS DAILY AS DIRECTED VIAPUMP 10 mL 1   insulin lispro (HUMALOG) 100 UNIT/ML injection USE UP TO 70 UNITS DAILY AS DIRECTED VIAPUMP 10 mL 5   linaclotide (LINZESS) 290 MCG CAPS capsule TAKE 1 CAPSULE (290 MCG TOTAL) BY MOUTH ONCE DAILY 90 capsule 3   PARAGARD INTRAUTERINE COPPER IU 1 Device by Intrauterine route. Every 10 years--inserted  2014     sertraline (ZOLOFT) 50 MG tablet TAKE 1 TABLET BY MOUTH DAILY 90 tablet 0   No current facility-administered medications for this visit.    Allergies  Allergen Reactions   Sulfa Antibiotics Hives    "massive hives"    Family History  Problem Relation Age of Onset   Hypertension Mother    Diabetes Mother    Hyperlipidemia Father    Hypertension Father    Heart disease Maternal Grandfather    Diabetes Maternal Grandfather    Stroke Maternal Grandfather    Hyperlipidemia Paternal Grandmother    Heart disease Paternal Grandmother    Hypertension Paternal Grandmother    Diabetes Paternal Grandmother    Stroke Paternal Grandmother    Hyperlipidemia Paternal Grandfather    Hypertension Paternal Grandfather    Stroke Paternal Grandfather    Cancer Maternal Grandmother        leukemia   Anesthesia problems Neg Hx    Hypotension Neg Hx    Malignant hyperthermia Neg Hx    Pseudochol deficiency Neg Hx     Social History   Socioeconomic History   Marital status: Married    Spouse name: Not on file   Number of children: Not on file   Years of education: Not on file   Highest education level: Not on file  Occupational History   Not on file  Tobacco  Use   Smoking status: Never   Smokeless tobacco: Never  Vaping Use   Vaping Use: Never used  Substance and Sexual Activity   Alcohol use: Yes    Comment: occasional none last 24hrs   Drug use: No   Sexual activity: Yes    Birth control/protection: I.U.D.  Other Topics Concern   Not on file  Social History Narrative   ** Merged History Encounter **       Social Determinants of Health   Financial Resource Strain: Not on file  Food Insecurity: Not on file  Transportation Needs: Not on file  Physical Activity: Not on file  Stress: Not on file  Social Connections: Not on file  Intimate Partner Violence: Not on file     Constitutional: Denies fever, malaise, fatigue, headache or abrupt weight changes.   HEENT: Denies eye pain, eye redness, ear pain, ringing in the ears, wax buildup, runny nose, nasal congestion, bloody nose, or sore throat. Respiratory: Denies difficulty breathing, shortness of breath, cough or sputum production.   Cardiovascular: Denies chest pain, chest tightness, palpitations or swelling in the hands or feet.  Gastrointestinal: Pt reports constipation. Denies abdominal pain, bloating, diarrhea or blood in the stool.  GU: Denies urgency, frequency, pain with urination, burning sensation, blood in urine, odor or discharge. Musculoskeletal: Denies decrease in range of motion, difficulty with gait, muscle pain or joint pain and swelling.  Skin: Patient reports skin changes to RLE.  Denies redness, lesions or ulcercations.  Neurological: Denies dizziness, difficulty with memory, difficulty with speech or problems with balance and coordination.  Psych: Pt has a history of anxiety and depression. Denies anxiety, SI/HI.  No other specific complaints in a complete review of systems (except as listed in HPI above).  Objective:   Physical Exam  BP (!) 112/59 (BP Location: Left Arm, Patient Position: Sitting, Cuff Size: Large)   Pulse 83   Temp 97.7 F (36.5 C) (Temporal)   Resp 17   Ht 5\' 11"  (1.803 m)   Wt 216 lb 3.2 oz (98.1 kg)   SpO2 100%   BMI 30.15 kg/m   Wt Readings from Last 3 Encounters:  08/19/21 210 lb (95.3 kg)  12/29/20 220 lb 4 oz (99.9 kg)  04/06/20 206 lb (93.4 kg)    General: Appears her stated age, obese, in NAD. Skin: Warm, dry and intact.  PVD changes noted of RLE but no ulcerations noted. HEENT: Head: normal shape and size; Eyes: sclera white and EOMs intact;   Cardiovascular: Normal rate and rhythm. S1,S2 noted.  No murmur, rubs or gallops noted. No JVD or BLE edema.  Varicose veins noted of BLE. Pulmonary/Chest: Normal effort and positive vesicular breath sounds. No respiratory distress. No wheezes, rales or ronchi noted.  Musculoskeletal: No  difficulty with gait.  Neurological: Alert and oriented. Psychiatric: Mood and affect normal. Behavior is normal. Judgment and thought content normal.    BMET    Component Value Date/Time   NA 138 04/06/2020 1542   K 3.9 04/06/2020 1542   CL 103 04/06/2020 1542   CO2 25 04/06/2020 1542   GLUCOSE 147 (H) 04/06/2020 1542   BUN 12 04/06/2020 1542   CREATININE 0.63 04/06/2020 1542   CALCIUM 9.3 04/06/2020 1542   GFRNONAA >60 03/24/2020 1030   GFRAA >60 03/24/2020 1030    Lipid Panel     Component Value Date/Time   CHOL 227 (H) 04/06/2020 1542   TRIG 161.0 (H) 04/06/2020 1542   HDL 64.10 04/06/2020  1542   CHOLHDL 4 04/06/2020 1542   VLDL 32.2 04/06/2020 1542   LDLCALC 130 (H) 04/06/2020 1542    CBC    Component Value Date/Time   WBC 6.2 04/06/2020 1542   RBC 4.26 04/06/2020 1542   HGB 13.3 04/06/2020 1542   HCT 39.7 04/06/2020 1542   PLT 408.0 (H) 04/06/2020 1542   MCV 93.1 04/06/2020 1542   MCH 31.5 03/24/2020 1030   MCHC 33.5 04/06/2020 1542   RDW 13.7 04/06/2020 1542    Hgb A1C Lab Results  Component Value Date   HGBA1C 9.1 (H) 04/06/2020            Assessment & Plan:     Webb Silversmith, NP This visit occurred during the SARS-CoV-2 public health emergency.  Safety protocols were in place, including screening questions prior to the visit, additional usage of staff PPE, and extensive cleaning of exam room while observing appropriate contact time as indicated for disinfecting solutions.

## 2021-09-07 NOTE — Assessment & Plan Note (Signed)
Encourage diet and exercise for weight loss 

## 2021-09-07 NOTE — Assessment & Plan Note (Signed)
Stable on her current dose of Sertraline, refilled today Support offered

## 2021-09-07 NOTE — Assessment & Plan Note (Signed)
A1c today Urine microalbumin checked 05/2021 Encouraged her to consume a low-carb diet and exercise for weight loss Continue Humalog via pump Encourage routine foot exams Her eye exam scheduled, requested that they send me a copy Flu and Pneumovax today Encouraged her to get her COVID booster

## 2021-09-07 NOTE — Assessment & Plan Note (Signed)
CBC today.  

## 2021-09-07 NOTE — Assessment & Plan Note (Signed)
C-Met and lipid profile today Encouraged her to consume a low-fat diet Consider statin therapy if LDL >100

## 2021-09-08 LAB — COMPLETE METABOLIC PANEL WITH GFR
AG Ratio: 1.4 (calc) (ref 1.0–2.5)
ALT: 14 U/L (ref 6–29)
AST: 23 U/L (ref 10–30)
Albumin: 4.1 g/dL (ref 3.6–5.1)
Alkaline phosphatase (APISO): 107 U/L (ref 31–125)
BUN: 10 mg/dL (ref 7–25)
CO2: 27 mmol/L (ref 20–32)
Calcium: 9.4 mg/dL (ref 8.6–10.2)
Chloride: 102 mmol/L (ref 98–110)
Creat: 0.61 mg/dL (ref 0.50–0.99)
Globulin: 2.9 g/dL (calc) (ref 1.9–3.7)
Glucose, Bld: 186 mg/dL — ABNORMAL HIGH (ref 65–139)
Potassium: 4.8 mmol/L (ref 3.5–5.3)
Sodium: 138 mmol/L (ref 135–146)
Total Bilirubin: 1.6 mg/dL — ABNORMAL HIGH (ref 0.2–1.2)
Total Protein: 7 g/dL (ref 6.1–8.1)
eGFR: 115 mL/min/{1.73_m2} (ref >=60)

## 2021-09-08 LAB — CBC
HCT: 42.9 % (ref 35.0–45.0)
Hemoglobin: 13.7 g/dL (ref 11.7–15.5)
MCH: 30.4 pg (ref 27.0–33.0)
MCHC: 31.9 g/dL — ABNORMAL LOW (ref 32.0–36.0)
MCV: 95.3 fL (ref 80.0–100.0)
MPV: 10 fL (ref 7.5–12.5)
Platelets: 334 10*3/uL (ref 140–400)
RBC: 4.5 10*6/uL (ref 3.80–5.10)
RDW: 11.7 % (ref 11.0–15.0)
WBC: 6.8 10*3/uL (ref 3.8–10.8)

## 2021-09-08 LAB — HEMOGLOBIN A1C
Hgb A1c MFr Bld: 7.5 % of total Hgb — ABNORMAL HIGH (ref <5.7)
Mean Plasma Glucose: 169 mg/dL
eAG (mmol/L): 9.3 mmol/L

## 2021-09-08 LAB — LIPID PANEL
Cholesterol: 266 mg/dL — ABNORMAL HIGH (ref <200)
HDL: 85 mg/dL (ref >=50)
LDL Cholesterol (Calc): 163 mg/dL (calc) — ABNORMAL HIGH
Non-HDL Cholesterol (Calc): 181 mg/dL (calc) — ABNORMAL HIGH (ref <130)
Total CHOL/HDL Ratio: 3.1 (calc) (ref <5.0)
Triglycerides: 79 mg/dL (ref <150)

## 2022-01-01 ENCOUNTER — Encounter: Payer: Self-pay | Admitting: Internal Medicine

## 2022-01-01 MED ORDER — LINACLOTIDE 290 MCG PO CAPS: ORAL_CAPSULE | ORAL | 0 refills | Status: AC

## 2022-02-02 ENCOUNTER — Encounter: Payer: Self-pay | Admitting: Internal Medicine

## 2022-02-02 DIAGNOSIS — R2232 Localized swelling, mass and lump, left upper limb: Secondary | ICD-10-CM

## 2022-02-08 ENCOUNTER — Ambulatory Visit
Admission: RE | Admit: 2022-02-08 | Discharge: 2022-02-08 | Disposition: A | Discharge status: Home / Self Care | Payer: 59 | Source: Ambulatory Visit | Attending: Internal Medicine | Admitting: Internal Medicine

## 2022-02-08 DIAGNOSIS — R2232 Localized swelling, mass and lump, left upper limb: Secondary | ICD-10-CM | POA: Diagnosis present

## 2022-02-27 ENCOUNTER — Other Ambulatory Visit: Payer: Self-pay | Admitting: Internal Medicine

## 2022-02-28 NOTE — Telephone Encounter (Signed)
Requested Prescriptions  ?Pending Prescriptions Disp Refills  ?? sertraline (ZOLOFT) 50 MG tablet [Pharmacy Med Name: SERTRALINE HCL 50 MG TAB] 30 tablet 0  ?  Sig: TAKE ONE TABLET BY MOUTH EVERY DAY  ?  ? Psychiatry:  Antidepressants - SSRI - sertraline Passed - 02/27/2022 12:15 PM  ?  ?  Passed - AST in normal range and within 360 days  ?  AST  ?Date Value Ref Range Status  ?09/07/2021 23 10 - 30 U/L Final  ?   ?  ?  Passed - ALT in normal range and within 360 days  ?  ALT  ?Date Value Ref Range Status  ?09/07/2021 14 6 - 29 U/L Final  ?   ?  ?  Passed - Completed PHQ-2 or PHQ-9 in the last 360 days  ?  ?  Passed - Valid encounter within last 6 months  ?  Recent Outpatient Visits   ?      ? 5 months ago Type 1 diabetes mellitus with hyperglycemia ([place])  ? [facility], Coralie Keens, NP  ?  ?  ? ?  ?  ?  ? ? ?

## 2022-03-13 LAB — HEMOGLOBIN A1C: Hemoglobin A1C: 7.5

## 2022-04-03 ENCOUNTER — Other Ambulatory Visit: Payer: Self-pay | Admitting: Internal Medicine

## 2022-04-04 NOTE — Telephone Encounter (Signed)
Pt needs appt. Left VM to call back.

## 2022-04-04 NOTE — Telephone Encounter (Signed)
Requested medication (s) are due for refill today: yes  Requested medication (s) are on the active medication list: yes  Last refill:  02/28/22 #30/0  Future visit scheduled: no  Notes to clinic:  pt is due for an appt, pt called [facility]     Requested Prescriptions  Pending Prescriptions Disp Refills   sertraline (ZOLOFT) 50 MG tablet [Pharmacy Med Name: SERTRALINE HCL 50 MG TAB] 30 tablet 0    Sig: TAKE ONE TABLET BY [place]     Psychiatry:  Antidepressants - SSRI - sertraline Failed - 04/03/2022  1:06 PM      Failed - Valid encounter within last 6 months    Recent Outpatient Visits           6 months ago Type 1 diabetes mellitus with hyperglycemia [facility])   [facility], [place] W, NP              Passed - AST in normal range and within 360 days    AST  Date Value Ref Range Status  09/07/2021 23 10 - 30 U/L Final         Passed - ALT in normal range and within 360 days    ALT  Date Value Ref Range Status  09/07/2021 14 6 - 29 U/L Final         Passed - Completed PHQ-2 or PHQ-9 in the last 360 days

## 2022-04-19 IMAGING — CT CT ABD-PELV W/ CM
2 of 4 series · 15 of 46 positions shown, 17 images · IV contrast (APPLIED)
Comparison: 11/30/2014

CLINICAL DATA: Left lower quadrant pain, nausea and vomiting since
yesterday, fever

EXAM:
CT ABDOMEN AND PELVIS WITH CONTRAST
TECHNIQUE: Multidetector CT imaging of the abdomen and pelvis was performed
using the standard protocol following bolus administration of
intravenous contrast.
CONTRAST:  100mL OMNIPAQUE IOHEXOL 300 MG/ML  SOLN

[Series 2: routine abd/pel with · axial · 0.74mm/px · z∈[-456,-16]mm · 12 of 101 slices shown, 14 images]
[im 9/101  soft-tissue]
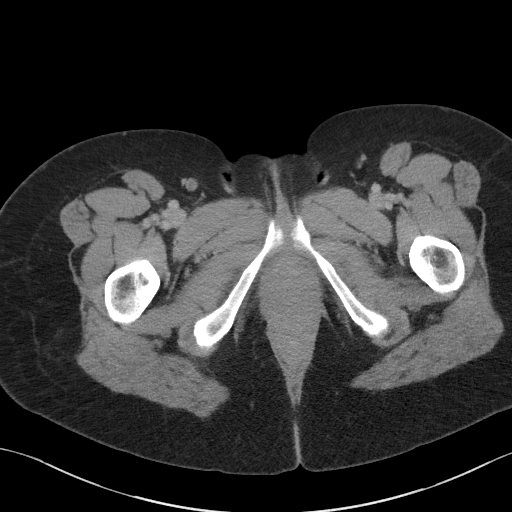
[im 9/101  bone]
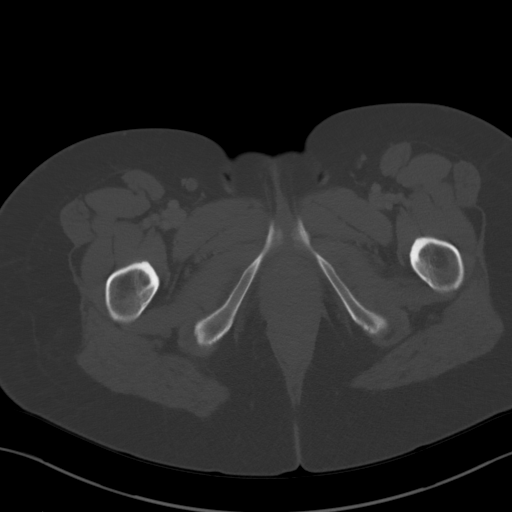
[im 17/101  soft-tissue]
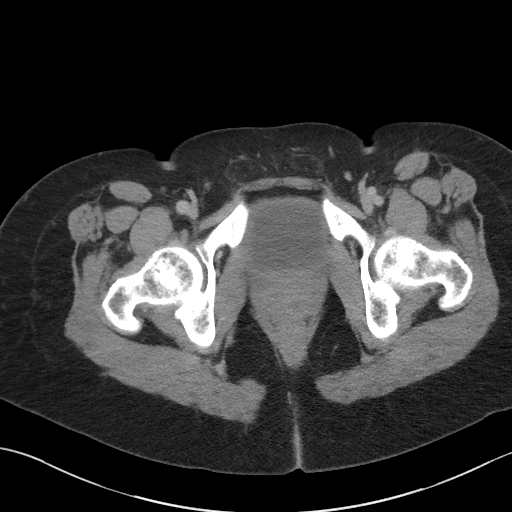
[im 25/101  soft-tissue]
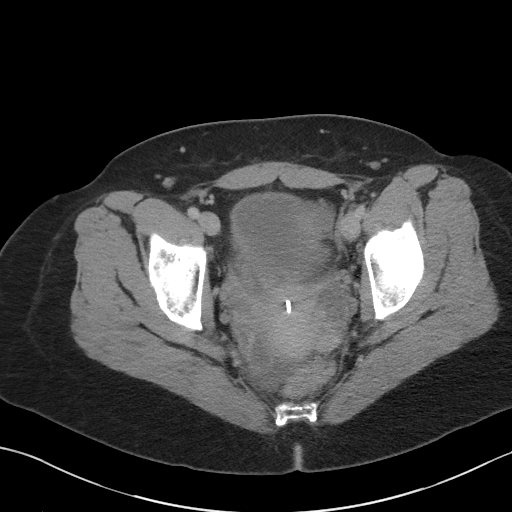
[im 33/101  soft-tissue]
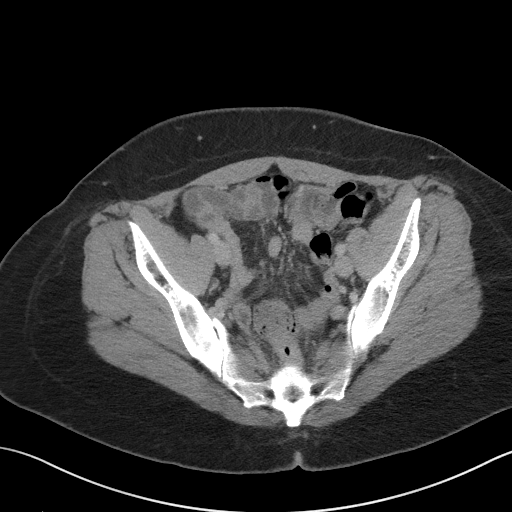
[im 41/101  soft-tissue]
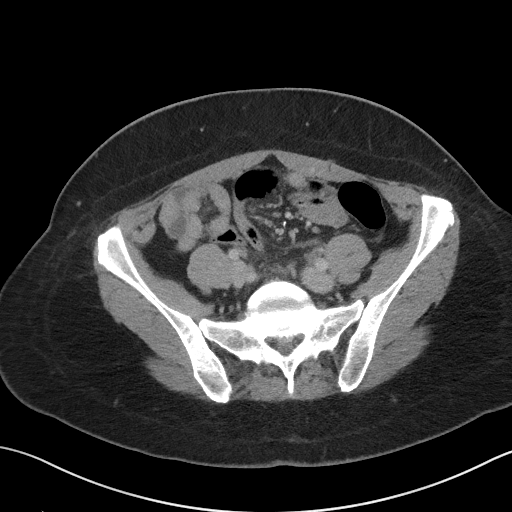
[im 49/101  soft-tissue]
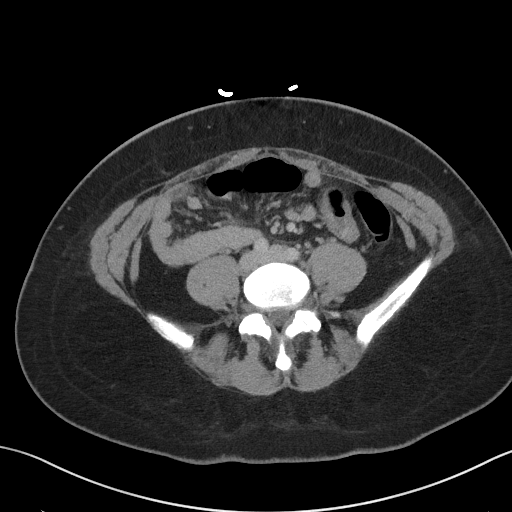
[im 57/101  soft-tissue]
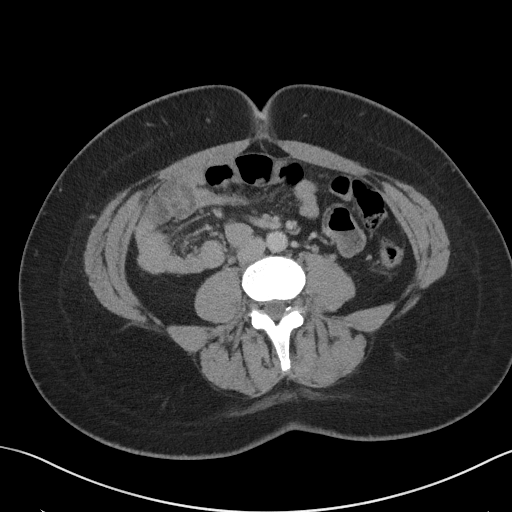
[im 65/101  soft-tissue]
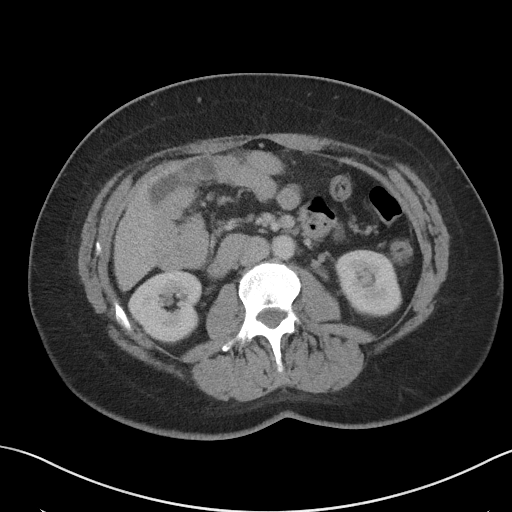
[im 73/101  soft-tissue]
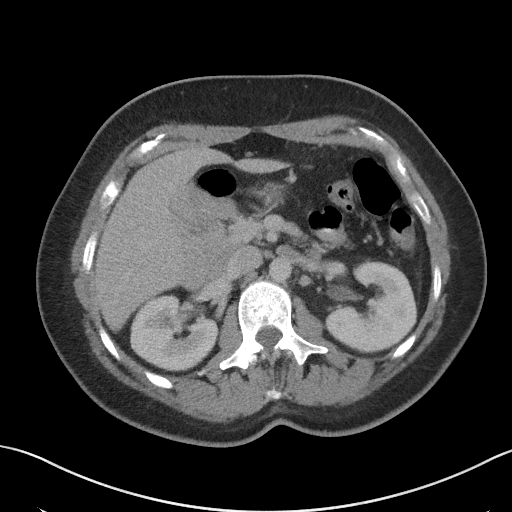
[im 73/101  bone]
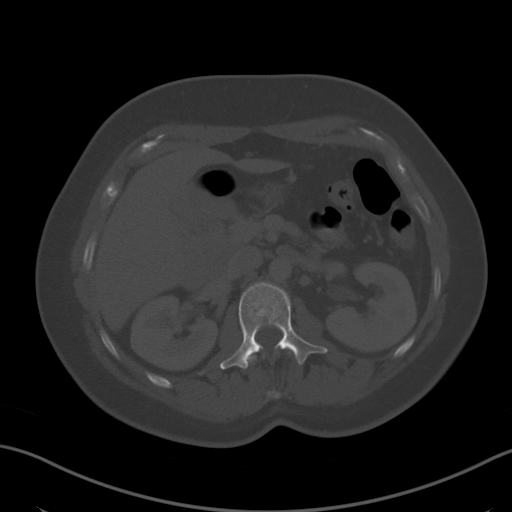
[im 81/101  soft-tissue]
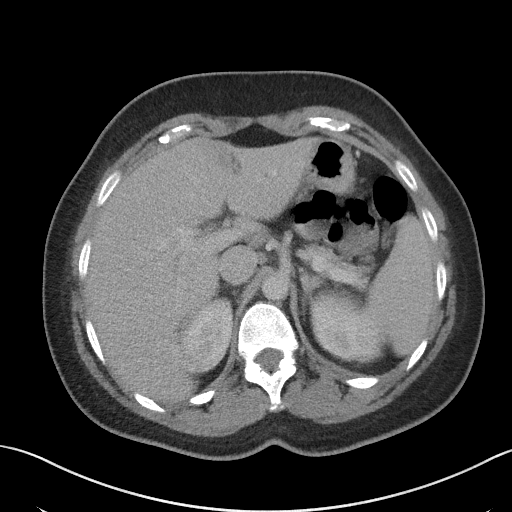
[im 89/101  soft-tissue]
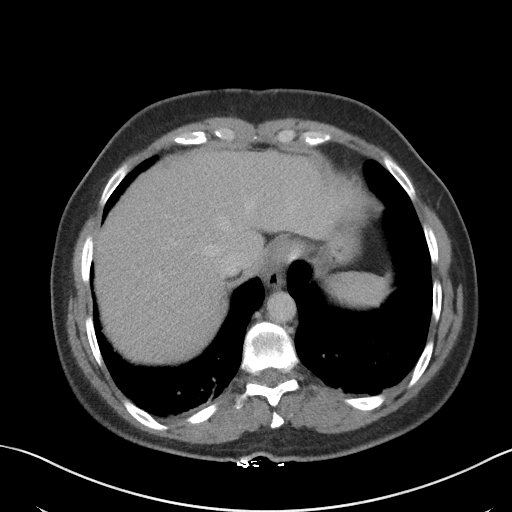
[im 97/101  soft-tissue]
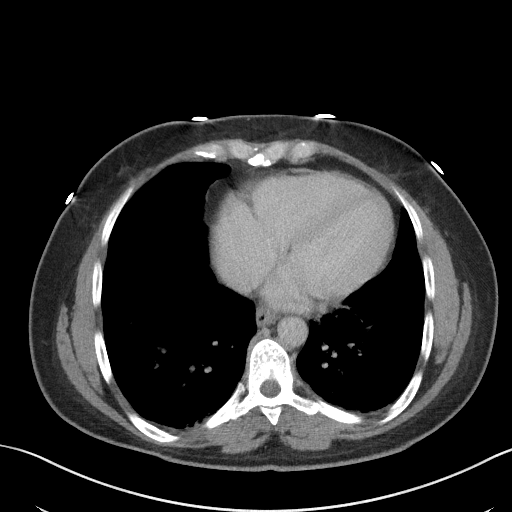

[Series 5: coronal st · coronal · 0.66mm/px · 3 of 101 slices shown]
[im 34/101  soft-tissue]
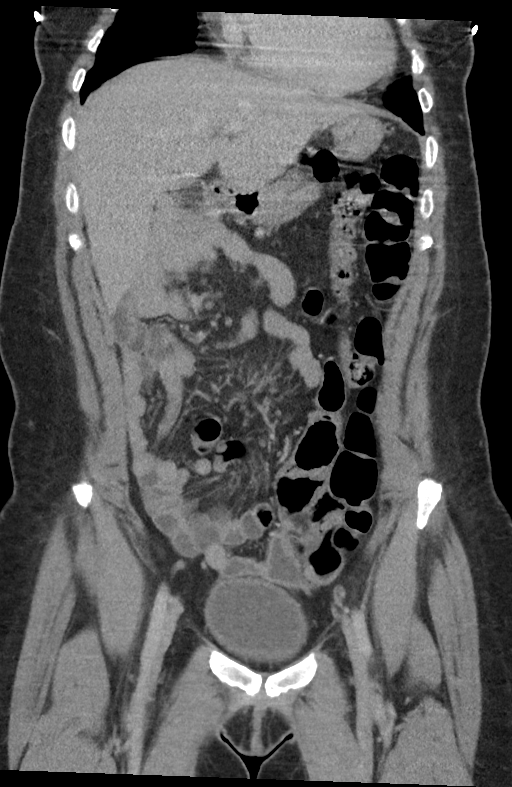
[im 45/101  soft-tissue]
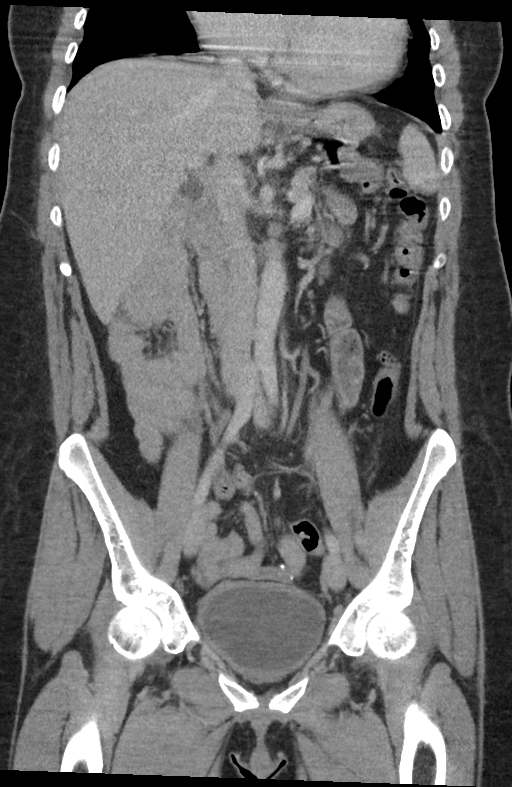
[im 56/101  soft-tissue]
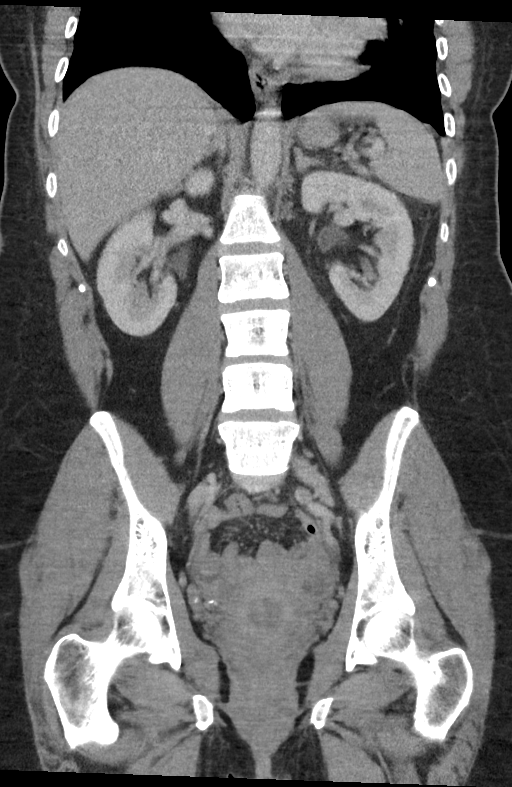

[15 of 46 positions shown; findings below may reference images not displayed]

FINDINGS: Lower chest: No acute pleural or parenchymal lung disease.

Hepatobiliary: No focal liver abnormality is seen. Status post
cholecystectomy. No biliary dilatation.

Pancreas: There is marked pancreatic parenchymal atrophy without
focal abnormality.

Spleen: Normal in size without focal abnormality.

Adrenals/Urinary Tract: Adrenal glands are unremarkable. Kidneys are
normal, without renal calculi, focal lesion, or hydronephrosis.
Bladder is unremarkable.

Stomach/Bowel: No bowel obstruction or ileus. Postsurgical changes
from appendectomy. Congenital malrotation of the bowel again noted
without evidence of complication.

Vascular/Lymphatic: No significant vascular findings are present. No
enlarged abdominal or pelvic lymph nodes.

Reproductive: IUD again seen within the uterus. No adnexal masses.

Other: Trace pelvic free fluid may be physiologic. No free
intra-abdominal gas.

Musculoskeletal: No acute or destructive bony lesions. Reconstructed
images demonstrate no additional findings.
IMPRESSION: 1. No acute intra-abdominal or intrapelvic process.
2. Trace pelvic free fluid may be physiologic.

## 2022-05-02 LAB — HM DIABETES EYE EXAM

## 2022-05-08 ENCOUNTER — Other Ambulatory Visit: Payer: Self-pay | Admitting: Internal Medicine

## 2022-05-09 NOTE — Telephone Encounter (Signed)
Requested medication (s) are due for refill today: yes  Requested medication (s) are on the active medication list: yes  Last refill:  02/28/22 #30  Future visit scheduled: no  Notes to clinic:  called pt on VM to call office to schedule appt. Pt previously was given a 30 day courtesy refill.   Requested Prescriptions  Pending Prescriptions Disp Refills   sertraline (ZOLOFT) 50 MG tablet [Pharmacy Med Name: SERTRALINE HCL 50 MG TAB] 30 tablet 0    Sig: TAKE ONE TABLET BY MOUTH EVERY DAY     Psychiatry:  Antidepressants - SSRI - sertraline Failed - 05/08/2022 12:50 PM      Failed - Valid encounter within last 6 months    Recent Outpatient Visits           8 months ago Type 1 diabetes mellitus with hyperglycemia [facility])   [facility], [place] W, NP              Passed - AST in normal range and within 360 days    AST  Date Value Ref Range Status  09/07/2021 23 10 - 30 U/L Final         Passed - ALT in normal range and within 360 days    ALT  Date Value Ref Range Status  09/07/2021 14 6 - 29 U/L Final         Passed - Completed PHQ-2 or PHQ-9 in the last 360 days

## 2022-05-18 ENCOUNTER — Ambulatory Visit (INDEPENDENT_AMBULATORY_CARE_PROVIDER_SITE_OTHER): Payer: 59 | Admitting: Internal Medicine

## 2022-05-18 ENCOUNTER — Encounter: Payer: Self-pay | Admitting: Internal Medicine

## 2022-05-18 VITALS — BP 132/80 | HR 78 | Temp 97.7°F | Ht 71.5 in | Wt 223.0 lb

## 2022-05-18 DIAGNOSIS — E6609 Other obesity due to excess calories: Secondary | ICD-10-CM | POA: Diagnosis not present

## 2022-05-18 DIAGNOSIS — B9689 Other specified bacterial agents as the cause of diseases classified elsewhere: Secondary | ICD-10-CM

## 2022-05-18 DIAGNOSIS — E1059 Type 1 diabetes mellitus with other circulatory complications: Secondary | ICD-10-CM | POA: Diagnosis not present

## 2022-05-18 DIAGNOSIS — Z0001 Encounter for general adult medical examination with abnormal findings: Secondary | ICD-10-CM

## 2022-05-18 DIAGNOSIS — Z1231 Encounter for screening mammogram for malignant neoplasm of breast: Secondary | ICD-10-CM | POA: Diagnosis not present

## 2022-05-18 DIAGNOSIS — Z6830 Body mass index (BMI) 30.0-30.9, adult: Secondary | ICD-10-CM

## 2022-05-18 DIAGNOSIS — J019 Acute sinusitis, unspecified: Secondary | ICD-10-CM

## 2022-05-18 MED ORDER — AZITHROMYCIN 250 MG PO TABS: ORAL_TABLET | ORAL | 0 refills | Status: DC

## 2022-05-18 NOTE — Patient Instructions (Signed)

## 2022-05-18 NOTE — Progress Notes (Signed)
Subjective:    Patient ID: Karen Byrd, female    DOB: 18-Apr-1981, 41 y.o.   MRN: [id number]  HPI  Patient presents to clinic today for her annual exam.  She also thinks she had a sinus infection.  She reports pressure behind her eyes, nasal congestion and right ear fullness.  This started yesterday.  She denies headache, dizziness, vision changes, sore throat, cough, shortness of breath.  She denies fever, chills or body aches.  She has tried Alka-Seltzer OTC with minimal relief of symptoms.  Flu: 08/2021 Tetanus: 09/2018 COVID: Moderna x3 Pneumovax: 08/2021 Pap smear: 11/2013 Mammogram: 04/2013 Vision screening: annually Dentist: biannually  Diet: She does eat meat. She consumes fruits and veggies. She tries to avoid fried foods. She drinks mostly water, coffee, and wine Exercise: Walking  Review of Systems     Past Medical History:  Diagnosis Date   Constipation    Depression    Diabetes mellitus    Diabetes mellitus without complication ([facility])    Dysphagia    VSD (ventricular septal defect)     Current Outpatient Medications  Medication Sig Dispense Refill   Continuous Blood Gluc Sensor (DEXCOM G6 SENSOR) MISC SMARTSIG:Topical Every 10 Days     Continuous Blood Gluc Sensor (DEXCOM G6 SENSOR) MISC Use to monitor blood sugar.  Replace every 10 days     Continuous Blood Gluc Transmit (DEXCOM G6 TRANSMITTER) MISC USE TO MONITOR BLOOD SUGAR, REPLACE EVERY 3 MONTHS     HUMALOG 100 UNIT/ML injection USE UP TO 70 UNITS DAILY AS DIRECTED VIAPUMP 10 mL 1   Insulin Disposable Pump (OMNIPOD 5 G6 INTRO, GEN 5,) KIT Inject into the skin.     Insulin Disposable Pump (OMNIPOD 5 G6 POD, GEN 5,) MISC Inject into the skin.     insulin lispro (HUMALOG) 100 UNIT/ML injection USE UP TO 70 UNITS DAILY AS DIRECTED VIAPUMP 10 mL 5   linaclotide (LINZESS) 290 MCG CAPS capsule TAKE 1 CAPSULE (290 MCG TOTAL) BY MOUTH ONCE DAILY 90 capsule 0   PARAGARD INTRAUTERINE COPPER IU 1 Device by  Intrauterine route. Every 10 years--inserted 2014     sertraline (ZOLOFT) 50 MG tablet TAKE ONE TABLET BY MOUTH EVERY DAY 30 tablet 0   No current facility-administered medications for this visit.    Allergies  Allergen Reactions   Sulfa Antibiotics Hives    "massive hives"    Family History  Problem Relation Age of Onset   Hypertension Mother    Diabetes Mother    Hyperlipidemia Father    Hypertension Father    Heart disease Maternal Grandfather    Diabetes Maternal Grandfather    Stroke Maternal Grandfather    Hyperlipidemia Paternal Grandmother    Heart disease Paternal Grandmother    Hypertension Paternal Grandmother    Diabetes Paternal Grandmother    Stroke Paternal Grandmother    Hyperlipidemia Paternal Grandfather    Hypertension Paternal Grandfather    Stroke Paternal Grandfather    Cancer Maternal Grandmother        leukemia   Anesthesia problems Neg Hx    Hypotension Neg Hx    Malignant hyperthermia Neg Hx    Pseudochol deficiency Neg Hx     Social History   Socioeconomic History   Marital status: Married    Spouse name: Not on file   Number of children: Not on file   Years of education: Not on file   Highest education level: Not on file  Occupational History  Not on file  Tobacco Use   Smoking status: Never   Smokeless tobacco: Never  Vaping Use   Vaping Use: Never used  Substance and Sexual Activity   Alcohol use: Yes    Comment: occasional none last 24hrs   Drug use: No   Sexual activity: Yes    Birth control/protection: I.U.D.  Other Topics Concern   Not on file  Social History Narrative   ** Merged History Encounter **       Social Determinants of Health   Financial Resource Strain: Not on file  Food Insecurity: Not on file  Transportation Needs: Not on file  Physical Activity: Not on file  Stress: Not on file  Social Connections: Not on file  Intimate Partner Violence: Not on file     Constitutional: Denies fever, malaise,  fatigue, headache or abrupt weight changes.  HEENT: Patient reports pressure behind eyes, nasal congestion, right ear fullness.  Denies eye pain, eye redness, ear pain, ringing in the ears, wax buildup, runny nose, bloody nose, or sore throat. Respiratory: Denies difficulty breathing, shortness of breath, cough or sputum production.   Cardiovascular: Denies chest pain, chest tightness, palpitations or swelling in the hands or feet.  Gastrointestinal: Patient reports constipation.  Denies abdominal pain, bloating, diarrhea or blood in the stool.  GU: Denies urgency, frequency, pain with urination, burning sensation, blood in urine, odor or discharge. Musculoskeletal: Denies decrease in range of motion, difficulty with gait, muscle pain or joint pain and swelling.  Skin: Denies redness, rashes, lesions or ulcercations.  Neurological: Denies dizziness, difficulty with memory, difficulty with speech or problems with balance and coordination.  Psych: Patient has a history of anxiety and depression.  Denies SI/HI.  No other specific complaints in a complete review of systems (except as listed in HPI above).  Objective:   Physical Exam   BP 132/80 (BP Location: Right Arm, Patient Position: Sitting, Cuff Size: Normal)   Pulse 78   Temp 97.7 F (36.5 C) (Temporal)   Ht 5' 11.5" (1.816 m)   Wt 223 lb (101.2 kg)   SpO2 99%   BMI 30.67 kg/m   Wt Readings from Last 3 Encounters:  09/07/21 216 lb 3.2 oz (98.1 kg)  08/19/21 210 lb (95.3 kg)  12/29/20 220 lb 4 oz (99.9 kg)    General: Appears her stated age, obese, in NAD. Skin: Warm, dry and intact. No ulcerations noted. HEENT: Head: normal shape and size; Eyes: sclera white, no icterus, conjunctiva pink, PERRLA and EOMs intact;  Neck:  Neck supple, trachea midline. No masses, lumps or thyromegaly present.  Cardiovascular: Normal rate and rhythm. S1,S2 noted.  No murmur, rubs or gallops noted. No JVD or BLE edema. Pulmonary/Chest: Normal  effort and positive vesicular breath sounds. No respiratory distress. No wheezes, rales or ronchi noted.  Abdomen:  Normal bowel sounds Musculoskeletal: Strength 5/5 BUE/BLE.  No difficulty with gait.  Neurological: Alert and oriented. Cranial nerves II-XII grossly intact. Coordination normal.  Psychiatric: Mood and affect normal. Behavior is normal. Judgment and thought content normal.    BMET    Component Value Date/Time   NA 138 09/07/2021 1025   K 4.8 09/07/2021 1025   CL 102 09/07/2021 1025   CO2 27 09/07/2021 1025   GLUCOSE 186 (H) 09/07/2021 1025   BUN 10 09/07/2021 1025   CREATININE 0.61 09/07/2021 1025   CALCIUM 9.4 09/07/2021 1025   GFRNONAA >60 03/24/2020 1030   GFRAA >60 03/24/2020 1030    Lipid Panel  Component Value Date/Time   CHOL 266 (H) 09/07/2021 1025   TRIG 79 09/07/2021 1025   HDL 85 09/07/2021 1025   CHOLHDL 3.1 09/07/2021 1025   VLDL 32.2 04/06/2020 1542   LDLCALC 163 (H) 09/07/2021 1025    CBC    Component Value Date/Time   WBC 6.8 09/07/2021 1025   RBC 4.50 09/07/2021 1025   HGB 13.7 09/07/2021 1025   HCT 42.9 09/07/2021 1025   PLT 334 09/07/2021 1025   MCV 95.3 09/07/2021 1025   MCH 30.4 09/07/2021 1025   MCHC 31.9 (L) 09/07/2021 1025   RDW 11.7 09/07/2021 1025    Hgb A1C Lab Results  Component Value Date   HGBA1C 7.5 (H) 09/07/2021           Assessment & Plan:   Preventative Health Maintenance:  Encouraged her to get a flu shot in the fall Tetanus UTD Encouraged her to get her COVID booster Pneumovax UTD Pap smear due although she states this is up-to-date, will defer to another time. Mammogram ordered-she will call to schedule Encouraged her to consume a balanced diet and exercise regimen Advised her to see an eye doctor and dentist annually A1c from 02/2022 reviewed.  Will defer labs till next visit  Acute Bacterial Sinusitis:  She is traveling in the next few days I would like to avoid steroids given her  history of diabetes Rx for Azithromycin 250 mg daily x5 days  RTC in 6 months, follow-up chronic conditions Webb Silversmith, NP

## 2022-05-18 NOTE — Assessment & Plan Note (Signed)
Encourage diet and exercise for weight loss 

## 2022-05-18 NOTE — Assessment & Plan Note (Signed)
Follows with endocrinology ?

## 2022-05-25 ENCOUNTER — Ambulatory Visit: Payer: Self-pay

## 2022-05-25 ENCOUNTER — Encounter: Payer: Self-pay | Admitting: Internal Medicine

## 2022-05-25 NOTE — Telephone Encounter (Signed)
Summary: Advice Not feeling better   Pt seen in office on 05/18/22 Pt prescribed azithromycin (ZITHROMAX) 250 MG tablet [[id number]] . Pt reports not feeling better. Please advise     Congestion, pressure behind eyes, sore throat, headache Fever yesterday. (102) x 4 hours\ same as before  Blocked nose  Chief Complaint: does not feel any better today Symptoms: sinus pain and pressure, fever yesterday to 102, pressure behind the eyes, sore throat, headache, both nostrils blocked. Productive cough (pt doesn't know what color phlegm is) Frequency: OV 05/18/22 -unsure when sinus issue started. Is currently on abx Pertinent Negatives: Patient denies SOB, difficulty breathing Disposition: '[]'$ ED /'[]'$ Urgent Care (no appt availability in office) / '[]'$ Appointment(In office/virtual)/ '[]'$  [facility]/ '[]'$ Home Care/ '[]'$ Refused Recommended Disposition /'[]'$ [facility]/ '[x]'$  Follow-up with PCP Additional Notes: Discussed care advice (special attention to nasal saline washes)- coughing up phlegm is new. Asked pt to send MyChart message with color of phlegm  Reason for Disposition  [1] Taking antibiotic > 72 hours (3 days) AND [2] sinus pain not improved    Will send note to PCP first  Answer Assessment - Initial Assessment Questions 1. ANTIBIOTIC: "What antibiotic are you taking?" "How many times a day?"     2 more days left until finished  2. ONSET: "When was the antibiotic started?"     Tuesday 3. PAIN: "How bad is the sinus pain?"   (Scale 1-10; mild, moderate or severe)   - MILD (1-3): doesn't interfere with normal activities    - MODERATE (4-7): interferes with normal activities (e.g., work or school) or awakens from sleep   - SEVERE (8-10): excruciating pain and patient unable to do any normal activities        moderate 4. FEVER: "Do you have a fever?" If Yes, ask: "What is it, how was it measured, and when did it start?"      102 x 4 hours took advil 5. SYMPTOMS: "Are there any other  symptoms you're concerned about?" If Yes, ask: "When did it start?"     Headache, sore throat, productive  Protocols used: Sinus Infection on Antibiotic Follow-up Call-[facility]

## 2022-05-25 NOTE — Telephone Encounter (Signed)
Not a [facility] patient.

## 2022-06-12 ENCOUNTER — Other Ambulatory Visit: Payer: Self-pay | Admitting: Internal Medicine

## 2022-06-13 NOTE — Telephone Encounter (Signed)
Requested medication (s) are due for refill today: yes  Requested medication (s) are on the active medication list: yes  Last refill:  05/10/22 #30 0 refills  Future visit scheduled: no  Notes to clinic:  last seen in office 05/18/22 . Do you want to allow more refills for Rx?     Requested Prescriptions  Pending Prescriptions Disp Refills   sertraline (ZOLOFT) 50 MG tablet [Pharmacy Med Name: SERTRALINE HCL 50 MG TAB] 30 tablet 0    Sig: TAKE ONE TABLET BY MOUTH EVERY DAY     Psychiatry:  Antidepressants - SSRI - sertraline Failed - 06/12/2022 12:25 PM      Failed - Valid encounter within last 6 months    Recent Outpatient Visits           3 weeks ago Encounter for general adult medical examination with abnormal findings   [facility] [place], Coralie Keens, NP   9 months ago Type 1 diabetes mellitus with hyperglycemia [facility])   [facility] Coralie Keens, NP              Passed - AST in normal range and within 360 days    AST  Date Value Ref Range Status  09/07/2021 23 10 - 30 U/L Final         Passed - ALT in normal range and within 360 days    ALT  Date Value Ref Range Status  09/07/2021 14 6 - 29 U/L Final         Passed - Completed PHQ-2 or PHQ-9 in the last 360 days

## 2022-06-13 NOTE — Telephone Encounter (Signed)
Called pharmacy to verify medication refill has not been received. Refill still pending from PCP.

## 2022-11-23 ENCOUNTER — Other Ambulatory Visit: Payer: Self-pay | Admitting: Internal Medicine

## 2022-11-23 NOTE — Telephone Encounter (Signed)
Requested medications are due for refill today.  yes  Requested medications are on the active medications list.  yes  Last refill. 06/13/2022 #90 1 rf  Future visit scheduled.   no  Notes to clinic.  Pt needs an appt. Labs are expired.    Requested Prescriptions  Pending Prescriptions Disp Refills   sertraline (ZOLOFT) 50 MG tablet [Pharmacy Med Name: SERTRALINE HCL 50 MG TAB] 90 tablet 1    Sig: TAKE ONE TABLET BY MOUTH EVERY DAY     Psychiatry:  Antidepressants - SSRI - sertraline Failed - 11/23/2022 12:46 PM      Failed - AST in normal range and within 360 days    AST  Date Value Ref Range Status  09/07/2021 23 10 - 30 U/L Final         Failed - ALT in normal range and within 360 days    ALT  Date Value Ref Range Status  09/07/2021 14 6 - 29 U/L Final         Failed - Completed PHQ-2 or PHQ-9 in the last 360 days      Failed - Valid encounter within last 6 months    Recent Outpatient Visits           6 months ago Encounter for general adult medical examination with abnormal findings   [facility] [place], [place], [place]   1 year ago Type 1 diabetes mellitus with hyperglycemia [facility])   [facility], Coralie Keens, [place]

## 2022-12-24 ENCOUNTER — Encounter: Payer: Self-pay | Admitting: Internal Medicine

## 2022-12-26 ENCOUNTER — Ambulatory Visit: Payer: 59 | Admitting: Internal Medicine

## 2022-12-26 ENCOUNTER — Encounter: Payer: Self-pay | Admitting: Internal Medicine

## 2022-12-26 ENCOUNTER — Other Ambulatory Visit: Payer: Self-pay

## 2022-12-26 VITALS — BP 126/74 | HR 79 | Ht 71.5 in | Wt 224.0 lb

## 2022-12-26 DIAGNOSIS — K219 Gastro-esophageal reflux disease without esophagitis: Secondary | ICD-10-CM | POA: Diagnosis not present

## 2022-12-26 DIAGNOSIS — E6609 Other obesity due to excess calories: Secondary | ICD-10-CM

## 2022-12-26 DIAGNOSIS — E782 Mixed hyperlipidemia: Secondary | ICD-10-CM

## 2022-12-26 DIAGNOSIS — K581 Irritable bowel syndrome with constipation: Secondary | ICD-10-CM

## 2022-12-26 DIAGNOSIS — F32A Depression, unspecified: Secondary | ICD-10-CM

## 2022-12-26 DIAGNOSIS — Z6830 Body mass index (BMI) 30.0-30.9, adult: Secondary | ICD-10-CM

## 2022-12-26 DIAGNOSIS — E1059 Type 1 diabetes mellitus with other circulatory complications: Secondary | ICD-10-CM | POA: Diagnosis not present

## 2022-12-26 DIAGNOSIS — F419 Anxiety disorder, unspecified: Secondary | ICD-10-CM

## 2022-12-26 MED ORDER — SERTRALINE HCL 50 MG PO TABS: 50.0000 mg | ORAL_TABLET | Freq: Every day | ORAL | 1 refills | Status: DC

## 2022-12-26 MED ORDER — BUPROPION HCL ER (XL) 150 MG PO TB24: 150.0000 mg | ORAL_TABLET | Freq: Every day | ORAL | 0 refills | Status: DC

## 2022-12-26 NOTE — Assessment & Plan Note (Signed)
Currently not an issue °We will monitor °

## 2022-12-26 NOTE — Assessment & Plan Note (Signed)
A1c and urine microalbumin today Encouraged her to consume a low-carb diet Continue Humalog insulin pump Encourage routine eye exam Encouraged routine foot exam Encouraged her to get a flu shot in the fall Pneumovax UTD COVID-vaccine UTD

## 2022-12-26 NOTE — Assessment & Plan Note (Signed)
Deteriorated We will wean sertraline Will start bupropion 150 mg daily Encouraged her to [facility] [place] for therapy Support offered

## 2022-12-26 NOTE — Assessment & Plan Note (Signed)
Encouraged high-fiber diet and adequate water intake Continue Linzess

## 2022-12-26 NOTE — Assessment & Plan Note (Signed)
Encourage diet and exercise for weight loss 

## 2022-12-26 NOTE — Assessment & Plan Note (Signed)
C-Met and lipid profile today Encouraged her to consume low-fat diet 

## 2022-12-26 NOTE — Progress Notes (Signed)
Subjective:    Patient ID: Karen Byrd, female    DOB: 10-Sep-1981, 42 y.o.   MRN: [id number]  HPI  Patient presents to clinic today for follow-up of chronic conditions.  IBS: Mainly constipation.  She takes Linzess as needed with good relief of symptoms.  Colonoscopy from 04/2018 reviewed.  GERD: Currently not an issue.  She is not taking any medications for this.  Upper GI from 04/2018 reviewed.  DM1: Her last A1c was 7.5%, 02/2022.  She has an insulin pump with Humalog.  She checks her feet routinely.  Her last eye exam was 04/2022.  Flu 08/2021.  Pneumovax 08/2021.  COVID Moderna x 3.  She follows with endocrinology.  Anxiety and Depression: Chronic, managed on Sertraline although she does not feel like this is working well. She has been having a lot of work related stress. She is not currently seeing a therapist.  She denies SI/HI.  HLD: Her last LDL was 163, triglycerides 79, 08/2021.  She is not taking any cholesterol-lowering medication at this time.  She does not consume low-fat diet.   Review of Systems     Past Medical History:  Diagnosis Date   Constipation    Depression    Diabetes mellitus    Diabetes mellitus without complication (HCC)    Dysphagia    VSD (ventricular septal defect)     Current Outpatient Medications  Medication Sig Dispense Refill   azithromycin (ZITHROMAX) 250 MG tablet Take 2 tabs today, then 1 tab daily x 4 days 6 tablet 0   Continuous Blood Gluc Sensor (DEXCOM G6 SENSOR) MISC SMARTSIG:Topical Every 10 Days     Continuous Blood Gluc Sensor (DEXCOM G6 SENSOR) MISC Use to monitor blood sugar.  Replace every 10 days     Continuous Blood Gluc Transmit (DEXCOM G6 TRANSMITTER) MISC USE TO MONITOR BLOOD SUGAR, REPLACE EVERY 3 MONTHS     HUMALOG 100 UNIT/ML injection USE UP TO 70 UNITS DAILY AS DIRECTED VIAPUMP 10 mL 1   Insulin Disposable Pump (OMNIPOD 5 G6 INTRO, GEN 5,) KIT Inject into the skin.     Insulin Disposable Pump (OMNIPOD 5 G6 POD, GEN  5,) MISC Inject into the skin.     linaclotide (LINZESS) 290 MCG CAPS capsule TAKE 1 CAPSULE (290 MCG TOTAL) BY MOUTH ONCE DAILY 90 capsule 0   PARAGARD INTRAUTERINE COPPER IU 1 Device by Intrauterine route. Every 10 years--inserted 2014     sertraline (ZOLOFT) 50 MG tablet TAKE ONE TABLET BY MOUTH EVERY DAY 90 tablet 1   No current facility-administered medications for this visit.    Allergies  Allergen Reactions   Sulfa Antibiotics Hives    "massive hives"    Family History  Problem Relation Age of Onset   Hypertension Mother    Diabetes Mother    Hyperlipidemia Father    Hypertension Father    Heart disease Maternal Grandfather    Diabetes Maternal Grandfather    Stroke Maternal Grandfather    Hyperlipidemia Paternal Grandmother    Heart disease Paternal Grandmother    Hypertension Paternal Grandmother    Diabetes Paternal Grandmother    Stroke Paternal Grandmother    Hyperlipidemia Paternal Grandfather    Hypertension Paternal Grandfather    Stroke Paternal Grandfather    Cancer Maternal Grandmother        leukemia   Anesthesia problems Neg Hx    Hypotension Neg Hx    Malignant hyperthermia Neg Hx    Pseudochol deficiency Neg Hx  Social History   Socioeconomic History   Marital status: Married    Spouse name: Not on file   Number of children: Not on file   Years of education: Not on file   Highest education level: Not on file  Occupational History   Not on file  Tobacco Use   Smoking status: Never   Smokeless tobacco: Never  Vaping Use   Vaping Use: Never used  Substance and Sexual Activity   Alcohol use: Yes    Comment: occasional none last 24hrs   Drug use: No   Sexual activity: Yes    Birth control/protection: I.U.D.  Other Topics Concern   Not on file  Social History Narrative   ** Merged History Encounter **       Social Determinants of Health   Financial Resource Strain: Not on file  Food Insecurity: Not on file  Transportation Needs:  Not on file  Physical Activity: Not on file  Stress: Not on file  Social Connections: Not on file  Intimate Partner Violence: Not on file     Constitutional: Denies fever, malaise, fatigue, headache or abrupt weight changes.  HEENT: Denies eye pain, eye redness, ear pain, ringing in the ears, wax buildup, runny nose, nasal congestion, bloody nose, or sore throat. Respiratory: Denies difficulty breathing, shortness of breath, cough or sputum production.   Cardiovascular: Denies chest pain, chest tightness, palpitations or swelling in the hands or feet.  Gastrointestinal: Patient reports constipation.  Denies abdominal pain, bloating, diarrhea or blood in the stool.  GU: Denies urgency, frequency, pain with urination, burning sensation, blood in urine, odor or discharge. Musculoskeletal: Denies decrease in range of motion, difficulty with gait, muscle pain or joint pain and swelling.  Skin: Denies redness, rashes, lesions or ulcercations.  Neurological: Denies dizziness, difficulty with memory, difficulty with speech or problems with balance and coordination.  Psych: Patient has a history of anxiety and depression.  Denies SI/HI.  No other specific complaints in a complete review of systems (except as listed in HPI above).  Objective:   Physical Exam    BP 126/74   Pulse 79   Ht 5' 11.5" (1.816 m)   Wt 224 lb (101.6 kg)   SpO2 98%   BMI 30.81 kg/m   Wt Readings from Last 3 Encounters:  05/18/22 223 lb (101.2 kg)  09/07/21 216 lb 3.2 oz (98.1 kg)  08/19/21 210 lb (95.3 kg)    General: Appears her stated age, obese, in NAD. Skin: Warm, dry and intact. No ulcerations noted. HEENT: Head: normal shape and size; Eyes: sclera white, no icterus, conjunctiva pink, PERRLA and EOMs intact;  Cardiovascular: Normal rate and rhythm. S1,S2 noted.  No murmur, rubs or gallops noted.  Pulmonary/Chest: Normal effort and positive vesicular breath sounds. No respiratory distress. No wheezes,  rales or ronchi noted.  Abdomen: Soft and nontender. Normal bowel sounds.  Musculoskeletal:  No difficulty with gait.  Neurological: Alert and oriented.  Coordination normal.  Psychiatric: Tearful. Behavior is normal. Judgment and thought content normal.     BMET    Component Value Date/Time   NA 138 09/07/2021 1025   K 4.8 09/07/2021 1025   CL 102 09/07/2021 1025   CO2 27 09/07/2021 1025   GLUCOSE 186 (H) 09/07/2021 1025   BUN 10 09/07/2021 1025   CREATININE 0.61 09/07/2021 1025   CALCIUM 9.4 09/07/2021 1025   GFRNONAA >60 03/24/2020 1030   GFRAA >60 03/24/2020 1030    Lipid Panel  Component Value Date/Time   CHOL 266 (H) 09/07/2021 1025   TRIG 79 09/07/2021 1025   HDL 85 09/07/2021 1025   CHOLHDL 3.1 09/07/2021 1025   VLDL 32.2 04/06/2020 1542   LDLCALC 163 (H) 09/07/2021 1025    CBC    Component Value Date/Time   WBC 6.8 09/07/2021 1025   RBC 4.50 09/07/2021 1025   HGB 13.7 09/07/2021 1025   HCT 42.9 09/07/2021 1025   PLT 334 09/07/2021 1025   MCV 95.3 09/07/2021 1025   MCH 30.4 09/07/2021 1025   MCHC 31.9 (L) 09/07/2021 1025   RDW 11.7 09/07/2021 1025    Hgb A1C Lab Results  Component Value Date   HGBA1C 7.5 03/13/2022          Assessment & Plan:     RTC in 6 months for annual exam Karen Silversmith, NP

## 2022-12-27 LAB — HEMOGLOBIN A1C
Hgb A1c MFr Bld: 7.5 % of total Hgb — ABNORMAL HIGH (ref <5.7)
Mean Plasma Glucose: 169 mg/dL
eAG (mmol/L): 9.3 mmol/L

## 2022-12-27 LAB — COMPLETE METABOLIC PANEL WITH GFR
AG Ratio: 1.3 (calc) (ref 1.0–2.5)
ALT: 14 U/L (ref 6–29)
AST: 25 U/L (ref 10–30)
Albumin: 4.3 g/dL (ref 3.6–5.1)
Alkaline phosphatase (APISO): 106 U/L (ref 31–125)
BUN: 11 mg/dL (ref 7–25)
CO2: 28 mmol/L (ref 20–32)
Calcium: 10 mg/dL (ref 8.6–10.2)
Chloride: 102 mmol/L (ref 98–110)
Creat: 0.63 mg/dL (ref 0.50–0.99)
Globulin: 3.2 g/dL (calc) (ref 1.9–3.7)
Glucose, Bld: 174 mg/dL — ABNORMAL HIGH (ref 65–99)
Potassium: 4.7 mmol/L (ref 3.5–5.3)
Sodium: 139 mmol/L (ref 135–146)
Total Bilirubin: 0.9 mg/dL (ref 0.2–1.2)
Total Protein: 7.5 g/dL (ref 6.1–8.1)
eGFR: 113 mL/min/{1.73_m2} (ref >=60)

## 2022-12-27 LAB — LIPID PANEL
Cholesterol: 262 mg/dL — ABNORMAL HIGH (ref <200)
HDL: 96 mg/dL (ref >=50)
LDL Cholesterol (Calc): 146 mg/dL (calc) — ABNORMAL HIGH
Non-HDL Cholesterol (Calc): 166 mg/dL (calc) — ABNORMAL HIGH (ref <130)
Total CHOL/HDL Ratio: 2.7 (calc) (ref <5.0)
Triglycerides: 94 mg/dL (ref <150)

## 2022-12-27 LAB — MICROALBUMIN / CREATININE URINE RATIO
Creatinine, Urine: 139 mg/dL (ref 20–275)
Microalb Creat Ratio: 6 mcg/mg creat (ref <30)
Microalb, Ur: 0.9 mg/dL

## 2022-12-27 LAB — CBC
HCT: 42.8 % (ref 35.0–45.0)
Hemoglobin: 14.1 g/dL (ref 11.7–15.5)
MCH: 31.2 pg (ref 27.0–33.0)
MCHC: 32.9 g/dL (ref 32.0–36.0)
MCV: 94.7 fL (ref 80.0–100.0)
MPV: 10.5 fL (ref 7.5–12.5)
Platelets: 348 10*3/uL (ref 140–400)
RBC: 4.52 10*6/uL (ref 3.80–5.10)
RDW: 12.3 % (ref 11.0–15.0)
WBC: 6.7 10*3/uL (ref 3.8–10.8)

## 2023-03-25 ENCOUNTER — Other Ambulatory Visit: Payer: Self-pay | Admitting: Internal Medicine

## 2023-03-26 NOTE — Telephone Encounter (Signed)
Requested Prescriptions  Pending Prescriptions Disp Refills   buPROPion (WELLBUTRIN XL) 150 MG 24 hr tablet [Pharmacy Med Name: BUPROPION HCL ER (XL) 150 MG TAB] 90 tablet 0    Sig: TAKE ONE TABLET BY MOUTH EVERY DAY     Psychiatry: Antidepressants - bupropion Passed - 03/25/2023 10:08 AM      Passed - Cr in normal range and within 360 days    Creat  Date Value Ref Range Status  12/26/2022 0.63 0.50 - 0.99 mg/dL Final   Creatinine,U  Date Value Ref Range Status  04/06/2020 170.0 mg/dL Final   Creatinine, Urine  Date Value Ref Range Status  12/26/2022 139 20 - 275 mg/dL Final         Passed - AST in normal range and within 360 days    AST  Date Value Ref Range Status  12/26/2022 25 10 - 30 U/L Final         Passed - ALT in normal range and within 360 days    ALT  Date Value Ref Range Status  12/26/2022 14 6 - 29 U/L Final         Passed - Completed PHQ-2 or PHQ-9 in the last 360 days      Passed - Last BP in normal range    BP Readings from Last 1 Encounters:  12/26/22 126/74         Passed - Valid encounter within last 6 months    Recent Outpatient Visits           3 months ago Type 1 diabetes mellitus with other circulatory complication [facility])   [place] [facility] [place], Salvadore [place], NP   10 months ago Encounter for general adult medical examination with abnormal findings   [place] [facility] [place], [place], NP   1 year ago Type 1 diabetes mellitus with hyperglycemia [facility])   [place] [facility] [place], Salvadore Oxford, NP       Future Appointments             In 3 months [place], Salvadore Oxford, NP [place] [facility], [facility]

## 2023-05-06 LAB — HM DIABETES EYE EXAM

## 2023-06-26 ENCOUNTER — Other Ambulatory Visit: Payer: Self-pay | Admitting: Internal Medicine

## 2023-06-27 NOTE — Telephone Encounter (Signed)
Appointment 07/01/23 Requested Prescriptions  Pending Prescriptions Disp Refills   buPROPion (WELLBUTRIN XL) 150 MG 24 hr tablet [Pharmacy Med Name: BUPROPION HCL ER (XL) 150 MG TAB] 90 tablet 0    Sig: TAKE ONE TABLET BY MOUTH EVERY DAY     Psychiatry: Antidepressants - bupropion Failed - 06/26/2023  1:14 PM      Failed - Valid encounter within last 6 months    Recent Outpatient Visits           6 months ago Type 1 diabetes mellitus with other circulatory complication [facility])   [place] [facility] [place], [place], NP   1 year ago Encounter for general adult medical examination with abnormal findings   [place] [facility] [place], Salvadore Oxford, NP   1 year ago Type 1 diabetes mellitus with hyperglycemia [facility])   [place] [facility] [place], Salvadore Oxford, NP       Future Appointments             In 4 days [place], Salvadore Oxford, NP  [facility]            Passed - Cr in normal range and within 360 days    Creat  Date Value Ref Range Status  12/26/2022 0.63 0.50 - 0.99 mg/dL Final   Creatinine,U  Date Value Ref Range Status  04/06/2020 170.0 mg/dL Final   Creatinine, Urine  Date Value Ref Range Status  12/26/2022 139 20 - 275 mg/dL Final         Passed - AST in normal range and within 360 days    AST  Date Value Ref Range Status  12/26/2022 25 10 - 30 U/L Final         Passed - ALT in normal range and within 360 days    ALT  Date Value Ref Range Status  12/26/2022 14 6 - 29 U/L Final         Passed - Completed PHQ-2 or PHQ-9 in the last 360 days      Passed - Last BP in normal range    BP Readings from Last 1 Encounters:  12/26/22 126/74

## 2023-07-01 ENCOUNTER — Encounter: Payer: 59 | Admitting: Internal Medicine

## 2023-07-06 ENCOUNTER — Ambulatory Visit: Payer: Self-pay

## 2023-07-15 ENCOUNTER — Ambulatory Visit (INDEPENDENT_AMBULATORY_CARE_PROVIDER_SITE_OTHER): Payer: 59 | Admitting: Internal Medicine

## 2023-07-15 ENCOUNTER — Ambulatory Visit
Admission: RE | Admit: 2023-07-15 | Discharge: 2023-07-15 | Disposition: A | Discharge status: Home / Self Care | Payer: 59 | Attending: Internal Medicine | Admitting: Internal Medicine

## 2023-07-15 ENCOUNTER — Encounter: Payer: Self-pay | Admitting: Internal Medicine

## 2023-07-15 ENCOUNTER — Ambulatory Visit
Admission: RE | Admit: 2023-07-15 | Discharge: 2023-07-15 | Disposition: A | Discharge status: Home / Self Care | Payer: 59 | Source: Ambulatory Visit | Attending: Internal Medicine | Admitting: Internal Medicine

## 2023-07-15 VITALS — BP 136/86 | HR 94 | Temp 97.1°F | Ht 71.0 in | Wt 227.0 lb

## 2023-07-15 DIAGNOSIS — Z0001 Encounter for general adult medical examination with abnormal findings: Secondary | ICD-10-CM | POA: Diagnosis not present

## 2023-07-15 DIAGNOSIS — Z124 Encounter for screening for malignant neoplasm of cervix: Secondary | ICD-10-CM | POA: Diagnosis not present

## 2023-07-15 DIAGNOSIS — Z1231 Encounter for screening mammogram for malignant neoplasm of breast: Secondary | ICD-10-CM | POA: Diagnosis not present

## 2023-07-15 DIAGNOSIS — E1059 Type 1 diabetes mellitus with other circulatory complications: Secondary | ICD-10-CM | POA: Diagnosis not present

## 2023-07-15 DIAGNOSIS — E6609 Other obesity due to excess calories: Secondary | ICD-10-CM

## 2023-07-15 DIAGNOSIS — M25512 Pain in left shoulder: Secondary | ICD-10-CM

## 2023-07-15 DIAGNOSIS — Z6831 Body mass index (BMI) 31.0-31.9, adult: Secondary | ICD-10-CM

## 2023-07-15 DIAGNOSIS — Z23 Encounter for immunization: Secondary | ICD-10-CM

## 2023-07-15 MED ORDER — CYCLOBENZAPRINE HCL 10 MG PO TABS
10.0000 mg | ORAL_TABLET | Freq: Every evening | ORAL | 0 refills | Status: DC | PRN
Start: 2023-07-15 — End: 2024-01-09

## 2023-07-15 MED ORDER — NAPROXEN 500 MG PO TABS: 500.0000 mg | ORAL_TABLET | Freq: Two times a day (BID) | ORAL | 0 refills | Status: DC

## 2023-07-15 NOTE — Progress Notes (Signed)
Subjective:    Patient ID: Karen Byrd, female    DOB: 03-Dec-1980, 42 y.o.   MRN: [id number]  HPI  Patient presents to clinic today for her annual exam.  She also reports left shoulder pain.  This started about 3 weeks ago.  She describes the pain as sharp and stabbing.  The pain does not radiate.  She denies numbness, tingling or weakness of her left upper extremity.  She denies any specific injury to the area.  She has been taking ibuprofen OTC with minimal relief of symptoms.  Flu: 08/2022 Tetanus: 09/2018 COVID: x 2 Pap smear: unsure Mammogram: > 2 years ago Vision screening: annually Dentist: biannually  Diet: She does eat meat. She consumes fruits and veggies. She does eat some fried foods. She drinks mostly dt. Soda, coffee, water. Exercise: walking  Review of Systems     Past Medical History:  Diagnosis Date   Constipation    Depression    Diabetes mellitus    Diabetes mellitus without complication (HCC)    Dysphagia    VSD (ventricular septal defect)     Current Outpatient Medications  Medication Sig Dispense Refill   azithromycin (ZITHROMAX) 250 MG tablet Take 2 tabs today, then 1 tab daily x 4 days 6 tablet 0   buPROPion (WELLBUTRIN XL) 150 MG 24 hr tablet TAKE ONE TABLET BY MOUTH EVERY DAY 90 tablet 0   Continuous Blood Gluc Sensor (DEXCOM G6 SENSOR) MISC SMARTSIG:Topical Every 10 Days     Continuous Blood Gluc Sensor (DEXCOM G6 SENSOR) MISC Use to monitor blood sugar.  Replace every 10 days     Continuous Blood Gluc Transmit (DEXCOM G6 TRANSMITTER) MISC USE TO MONITOR BLOOD SUGAR, REPLACE EVERY 3 MONTHS     HUMALOG 100 UNIT/ML injection USE UP TO 70 UNITS DAILY AS DIRECTED VIAPUMP 10 mL 1   Insulin Disposable Pump (OMNIPOD 5 G6 INTRO, GEN 5,) KIT Inject into the skin.     Insulin Disposable Pump (OMNIPOD 5 G6 POD, GEN 5,) MISC Inject into the skin.     linaclotide (LINZESS) 290 MCG CAPS capsule TAKE 1 CAPSULE (290 MCG TOTAL) BY MOUTH ONCE DAILY 90 capsule  0   PARAGARD INTRAUTERINE COPPER IU 1 Device by Intrauterine route. Every 10 years--inserted 2014     sertraline (ZOLOFT) 50 MG tablet Take 1 tablet (50 mg total) by mouth daily. 90 tablet 1   No current facility-administered medications for this visit.    Allergies  Allergen Reactions   Sulfa Antibiotics Hives    "massive hives"    Family History  Problem Relation Age of Onset   Hypertension Mother    Diabetes Mother    Hyperlipidemia Father    Hypertension Father    Heart disease Maternal Grandfather    Diabetes Maternal Grandfather    Stroke Maternal Grandfather    Hyperlipidemia Paternal Grandmother    Heart disease Paternal Grandmother    Hypertension Paternal Grandmother    Diabetes Paternal Grandmother    Stroke Paternal Grandmother    Hyperlipidemia Paternal Grandfather    Hypertension Paternal Grandfather    Stroke Paternal Grandfather    Cancer Maternal Grandmother        leukemia   Anesthesia problems Neg Hx    Hypotension Neg Hx    Malignant hyperthermia Neg Hx    Pseudochol deficiency Neg Hx     Social History   Socioeconomic History   Marital status: Married    Spouse name: Not on file  Number of children: Not on file   Years of education: Not on file   Highest education level: Not on file  Occupational History   Not on file  Tobacco Use   Smoking status: Never   Smokeless tobacco: Never  Vaping Use   Vaping status: Never Used  Substance and Sexual Activity   Alcohol use: Yes    Comment: occasional none last 24hrs   Drug use: No   Sexual activity: Yes    Birth control/protection: I.U.D.  Other Topics Concern   Not on file  Social History Narrative   ** Merged History Encounter **       Social Determinants of Health   Financial Resource Strain: Not on file  Food Insecurity: Not on file  Transportation Needs: Not on file  Physical Activity: Not on file  Stress: Not on file  Social Connections: Not on file  Intimate Partner  Violence: Not on file     Constitutional: Denies fever, malaise, fatigue, headache or abrupt weight changes.  HEENT: Denies eye pain, eye redness, ear pain, ringing in the ears, wax buildup, runny nose, nasal congestion, bloody nose, or sore throat. Respiratory: Denies difficulty breathing, shortness of breath, cough or sputum production.   Cardiovascular: Denies chest pain, chest tightness, palpitations or swelling in the hands or feet.  Gastrointestinal: Patient reports constipation.  Denies abdominal pain, bloating, diarrhea or blood in the stool.  GU: Denies urgency, frequency, pain with urination, burning sensation, blood in urine, odor or discharge. Musculoskeletal: Patient reports left shoulder pain.  Denies decrease in range of motion, difficulty with gait, muscle pain or joint swelling.  Skin: Denies redness, rashes, lesions or ulcercations.  Neurological: Denies dizziness, difficulty with memory, difficulty with speech or problems with balance and coordination.  Psych: Patient has a history of anxiety and depression.  Denies SI/HI.  No other specific complaints in a complete review of systems (except as listed in HPI above).  Objective:   Physical Exam BP 136/86 (BP Location: Left Arm, Patient Position: Sitting, Cuff Size: Normal)   Pulse 94   Temp (!) 97.1 F (36.2 C) (Temporal)   Ht 5\' 11"  (1.803 m)   Wt 227 lb (103 kg)   SpO2 96%   BMI 31.66 kg/m   Wt Readings from Last 3 Encounters:  12/26/22 224 lb (101.6 kg)  05/18/22 223 lb (101.2 kg)  09/07/21 216 lb 3.2 oz (98.1 kg)    General: Appears her stated age, obese, in NAD. Skin: Warm, dry and intact. No ulcerations noted. HEENT: Head: normal shape and size; Eyes: sclera white, no icterus, conjunctiva pink, PERRLA and EOMs intact;  Neck:  Neck supple, trachea midline. No masses, lumps or thyromegaly present.  Cardiovascular: Normal rate and rhythm. S1,S2 noted.  No murmur, rubs or gallops noted. No JVD or BLE edema.   Pulmonary/Chest: Normal effort and positive vesicular breath sounds. No respiratory distress. No wheezes, rales or ronchi noted.  Abdomen: Soft and nontender. Normal bowel sounds.  Musculoskeletal: Normal internal and external rotation of the left shoulder.  Pain with palpation over the [facility] joint and in the left subscapular region.  Positive drop can test on the left.  Strength 5/5 BUE/BLE.  Handgrips equal.  No difficulty with gait.  Neurological: Alert and oriented. Cranial nerves II-XII grossly intact. Coordination normal.  Psychiatric: Mood and affect normal.  Tearful. Judgment and thought content normal.     BMET    Component Value Date/Time   NA 139 12/26/2022 1041  K 4.7 12/26/2022 1041   CL 102 12/26/2022 1041   CO2 28 12/26/2022 1041   GLUCOSE 174 (H) 12/26/2022 1041   BUN 11 12/26/2022 1041   CREATININE 0.63 12/26/2022 1041   CALCIUM 10.0 12/26/2022 1041   GFRNONAA >60 03/24/2020 1030   GFRAA >60 03/24/2020 1030    Lipid Panel     Component Value Date/Time   CHOL 262 (H) 12/26/2022 1041   TRIG 94 12/26/2022 1041   HDL 96 12/26/2022 1041   CHOLHDL 2.7 12/26/2022 1041   VLDL 32.2 04/06/2020 1542   LDLCALC 146 (H) 12/26/2022 1041    CBC    Component Value Date/Time   WBC 6.7 12/26/2022 1041   RBC 4.52 12/26/2022 1041   HGB 14.1 12/26/2022 1041   HCT 42.8 12/26/2022 1041   PLT 348 12/26/2022 1041   MCV 94.7 12/26/2022 1041   MCH 31.2 12/26/2022 1041   MCHC 32.9 12/26/2022 1041   RDW 12.3 12/26/2022 1041    Hgb A1C Lab Results  Component Value Date   HGBA1C 7.5 (H) 12/26/2022            Assessment & Plan:   Preventative health maintenance:  Flu shot today Tetanus UTD Encourage her to get her COVID-vaccine Pap smear due, referral to GYN if she needs to have her IUD replaced Mammogram ordered-she will call to schedule Encouraged her to consume a balanced diet and exercise regimen Advised her to see an eye doctor and dentist annually We will  check CBC, c-Met, lipid, A1c today  Acute left shoulder pain:  DDx include osteoarthritis, muscle strain, nerve impingement, bursitis Toradol 30 mg IM x 1 today Will obtain x-ray left shoulder Rx for naproxen 500 mg twice daily x 1 week with food Rx for cyclobenzaprine 10 mg nightly.  Does sedation caution given Shoulder exercises given Advised her to try ice for 10 minutes twice daily  RTC in 6 months, follow-up chronic conditions Nicki Reaper, NP

## 2023-07-15 NOTE — Assessment & Plan Note (Signed)
Encouraged diet and exercise for weight loss ?

## 2023-07-15 NOTE — Patient Instructions (Signed)

## 2023-07-16 LAB — CBC
HCT: 40.9 % (ref 35.0–45.0)
Hemoglobin: 13.3 g/dL (ref 11.7–15.5)
MCH: 31.8 pg (ref 27.0–33.0)
MCHC: 32.5 g/dL (ref 32.0–36.0)
MCV: 97.8 fL (ref 80.0–100.0)
MPV: 10.5 fL (ref 7.5–12.5)
Platelets: 301 10*3/uL (ref 140–400)
RBC: 4.18 10*6/uL (ref 3.80–5.10)
RDW: 11.8 % (ref 11.0–15.0)
WBC: 8.3 10*3/uL (ref 3.8–10.8)

## 2023-07-16 LAB — HEMOGLOBIN A1C
Hgb A1c MFr Bld: 7.3 % of total Hgb — ABNORMAL HIGH (ref <5.7)
Mean Plasma Glucose: 163 mg/dL
eAG (mmol/L): 9 mmol/L

## 2023-07-16 LAB — COMPLETE METABOLIC PANEL WITH GFR
AG Ratio: 1.3 (calc) (ref 1.0–2.5)
ALT: 15 U/L (ref 6–29)
AST: 20 U/L (ref 10–30)
Albumin: 4 g/dL (ref 3.6–5.1)
Alkaline phosphatase (APISO): 95 U/L (ref 31–125)
BUN: 11 mg/dL (ref 7–25)
CO2: 28 mmol/L (ref 20–32)
Calcium: 9.5 mg/dL (ref 8.6–10.2)
Chloride: 101 mmol/L (ref 98–110)
Creat: 0.64 mg/dL (ref 0.50–0.99)
Globulin: 3 g/dL (calc) (ref 1.9–3.7)
Glucose, Bld: 225 mg/dL — ABNORMAL HIGH (ref 65–99)
Potassium: 4.5 mmol/L (ref 3.5–5.3)
Sodium: 138 mmol/L (ref 135–146)
Total Bilirubin: 1.3 mg/dL — ABNORMAL HIGH (ref 0.2–1.2)
Total Protein: 7 g/dL (ref 6.1–8.1)
eGFR: 112 mL/min/{1.73_m2} (ref >=60)

## 2023-07-16 LAB — MICROALBUMIN / CREATININE URINE RATIO
Creatinine, Urine: 193 mg/dL (ref 20–275)
Microalb Creat Ratio: 4 mg/g creat (ref <30)
Microalb, Ur: 0.8 mg/dL

## 2023-07-16 LAB — LIPID PANEL
Cholesterol: 251 mg/dL — ABNORMAL HIGH (ref <200)
HDL: 96 mg/dL (ref >=50)
LDL Cholesterol (Calc): 138 mg/dL (calc) — ABNORMAL HIGH
Non-HDL Cholesterol (Calc): 155 mg/dL (calc) — ABNORMAL HIGH (ref <130)
Total CHOL/HDL Ratio: 2.6 (calc) (ref <5.0)
Triglycerides: 75 mg/dL (ref <150)

## 2023-07-25 ENCOUNTER — Encounter: Payer: Self-pay | Admitting: Internal Medicine

## 2023-07-25 DIAGNOSIS — M25512 Pain in left shoulder: Secondary | ICD-10-CM

## 2023-07-25 MED ORDER — ATORVASTATIN CALCIUM 10 MG PO TABS: 10.0000 mg | ORAL_TABLET | Freq: Every day | ORAL | 1 refills | Status: DC

## 2023-09-05 ENCOUNTER — Encounter: Payer: Self-pay | Admitting: Internal Medicine

## 2023-09-09 MED ORDER — VENLAFAXINE HCL ER 37.5 MG PO CP24: 37.5000 mg | ORAL_CAPSULE | Freq: Every day | ORAL | 1 refills | Status: DC

## 2023-09-09 NOTE — Addendum Note (Signed)
Addended by: Lorre Munroe on: 09/09/2023 09:33 AM   Modules accepted: Orders

## 2023-09-14 IMAGING — CR DG HAND 2V*R*
2 series · 2 of 2 positions shown · non-contrast
Comparison: None.

CLINICAL DATA: Right hand pain

EXAM:
RIGHT HAND - 2 VIEW

[hand ap]
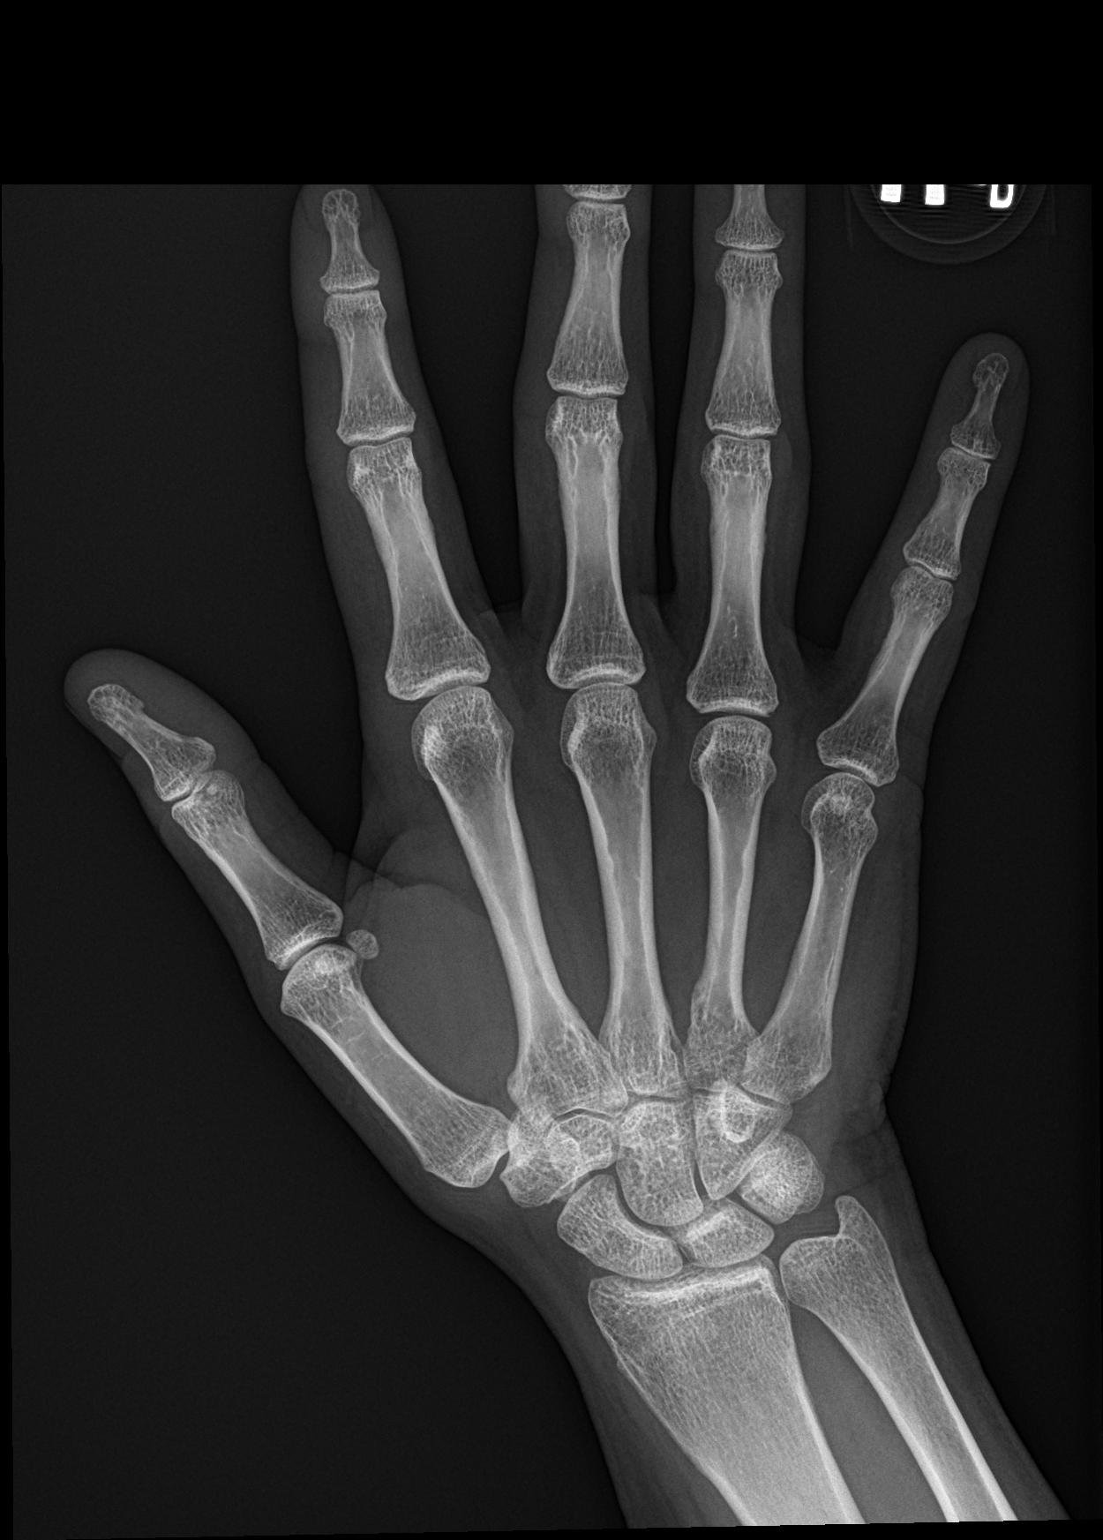

[hand lat]
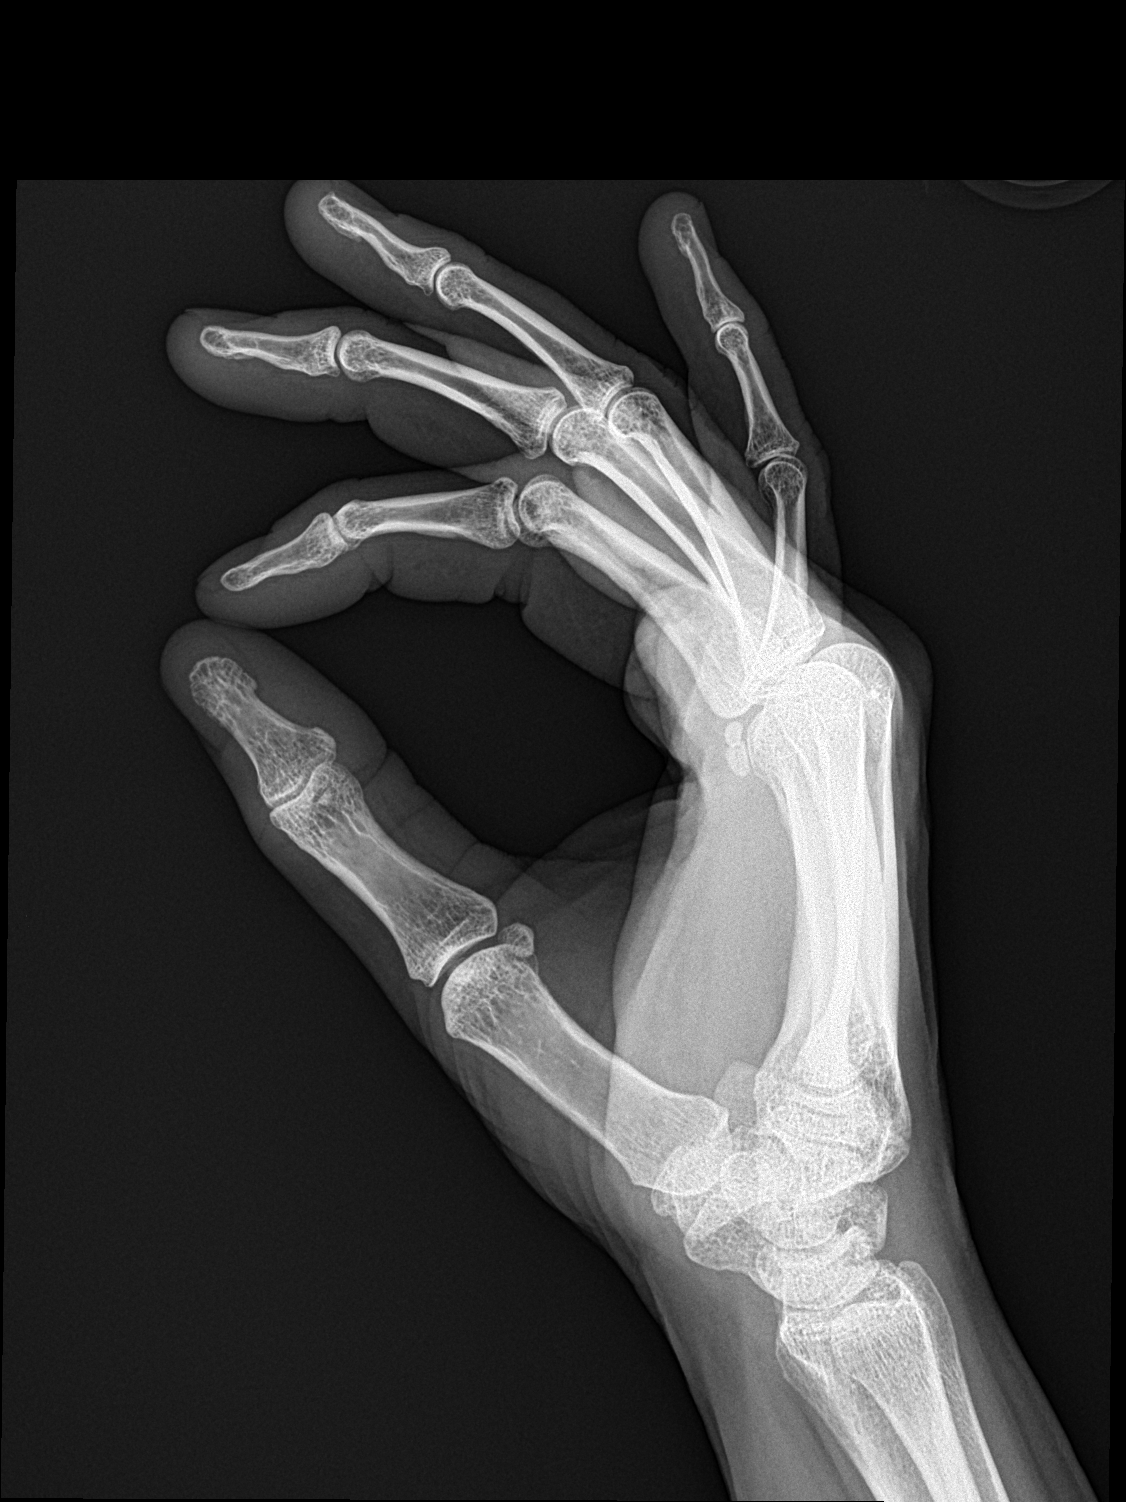

[2 of 2 positions shown; findings below may reference images not displayed]

FINDINGS: No acute fracture or dislocation. No aggressive osseous lesion.
Normal alignment. Mild osteoarthritis of the fifth D IP joint.

Soft tissue are unremarkable. No radiopaque foreign body or soft
tissue emphysema.
IMPRESSION: No acute osseous injury of the right hand.

## 2023-09-27 ENCOUNTER — Other Ambulatory Visit: Payer: Self-pay | Admitting: Internal Medicine

## 2023-09-30 NOTE — Telephone Encounter (Signed)
Requested Prescriptions  Pending Prescriptions Disp Refills   buPROPion (WELLBUTRIN XL) 150 MG 24 hr tablet [Pharmacy Med Name: BUPROPION HCL ER (XL) 150 MG TAB] 90 tablet 1    Sig: TAKE ONE TABLET BY MOUTH EVERY DAY     Psychiatry: Antidepressants - bupropion Passed - 09/27/2023  1:37 PM      Passed - Cr in normal range and within 360 days    Creat  Date Value Ref Range Status  07/15/2023 0.64 0.50 - 0.99 mg/dL Final   Creatinine,U  Date Value Ref Range Status  04/06/2020 170.0 mg/dL Final   Creatinine, Urine  Date Value Ref Range Status  07/15/2023 193 20 - 275 mg/dL Final         Passed - AST in normal range and within 360 days    AST  Date Value Ref Range Status  07/15/2023 20 10 - 30 U/L Final         Passed - ALT in normal range and within 360 days    ALT  Date Value Ref Range Status  07/15/2023 15 6 - 29 U/L Final         Passed - Completed PHQ-2 or PHQ-9 in the last 360 days      Passed - Last BP in normal range    BP Readings from Last 1 Encounters:  07/15/23 136/86         Passed - Valid encounter within last 6 months    Recent Outpatient Visits           2 months ago Encounter for general adult medical examination with abnormal findings   [place] [facility] [place], [place], NP   9 months ago Type 1 diabetes mellitus with other circulatory complication [facility])   [place] [facility] [place], Salvadore [place], NP   1 year ago Encounter for general adult medical examination with abnormal findings   [place] [facility] [place], [place] W, NP   2 years ago Type 1 diabetes mellitus with hyperglycemia [facility])   [place] [facility] [place], Salvadore Oxford, NP       Future Appointments             In 3 months Baity, Salvadore [place], NP [place] [facility], [facility]

## 2023-10-11 ENCOUNTER — Other Ambulatory Visit: Payer: Self-pay | Admitting: Internal Medicine

## 2023-10-14 NOTE — Telephone Encounter (Signed)
discontinued on 07/15/2023 by Lorre Munroe, NP    Requested Prescriptions  Refused Prescriptions Disp Refills   sertraline (ZOLOFT) 50 MG tablet [Pharmacy Med Name: SERTRALINE HCL 50 MG TAB] 90 tablet 1    Sig: TAKE 1 TABLET BY MOUTH ONCE DAILY     Psychiatry:  Antidepressants - SSRI - sertraline Passed - 10/14/2023  8:04 AM      Passed - AST in normal range and within 360 days    AST  Date Value Ref Range Status  07/15/2023 20 10 - 30 U/L Final         Passed - ALT in normal range and within 360 days    ALT  Date Value Ref Range Status  07/15/2023 15 6 - 29 U/L Final         Passed - Completed PHQ-2 or PHQ-9 in the last 360 days      Passed - Valid encounter within last 6 months    Recent Outpatient Visits           3 months ago Encounter for general adult medical examination with abnormal findings   [place] [facility] [place], [place], NP   9 months ago Type 1 diabetes mellitus with other circulatory complication [facility])   [place] [facility] [place], Salvadore [place], NP   1 year ago Encounter for general adult medical examination with abnormal findings   [place] [facility] [place], [place], NP   2 years ago Type 1 diabetes mellitus with hyperglycemia [facility])   [place] [facility] [place], Salvadore Oxford, NP       Future Appointments             In 3 months Baity, Salvadore Oxford, NP [place] [facility], [facility]

## 2023-10-28 ENCOUNTER — Other Ambulatory Visit: Payer: Self-pay | Admitting: Internal Medicine

## 2023-10-31 NOTE — Telephone Encounter (Signed)
 Requested Prescriptions  Refused Prescriptions Disp Refills   atorvastatin  (LIPITOR) 10 MG tablet [Pharmacy Med Name: ATORVASTATIN  CALCIUM  10 MG TAB] 90 tablet 1    Sig: TAKE ONE TABLET BY MOUTH EVERY DAY     Cardiovascular:  Antilipid - Statins Failed - 10/31/2023  9:01 AM      Failed - Lipid Panel in normal range within the last 12 months    Cholesterol  Date Value Ref Range Status  07/15/2023 251 (H) <200 mg/dL Final   LDL Cholesterol (Calc)  Date Value Ref Range Status  07/15/2023 138 (H) mg/dL (calc) Final    Comment:    Reference range: <100 . Desirable range <100 mg/dL for primary prevention;   <70 mg/dL for patients with CHD or diabetic patients  with > or = 2 CHD risk factors. SABRA LDL-C is now calculated using the Martin-Hopkins  calculation, which is a validated novel method providing  better accuracy than the Friedewald equation in the  estimation of LDL-C.  Gladis APPLETHWAITE et al. SANDREA. 7986;689(80): 2061-2068  (http://education.[employer].com/faq/FAQ164)    HDL  Date Value Ref Range Status  07/15/2023 96 > OR = 50 mg/dL Final   Triglycerides  Date Value Ref Range Status  07/15/2023 75 <150 mg/dL Final         Passed - Patient is not pregnant      Passed - Valid encounter within last 12 months    Recent Outpatient Visits           3 months ago Encounter for general adult medical examination with abnormal findings   [place] [facility] [place], [place], NP   10 months ago Type 1 diabetes mellitus with other circulatory complication [facility])   [place] [facility] [place], Angeline ORN, NP   1 year ago Encounter for general adult medical examination with abnormal findings   [place] [facility] [place], [place] W, NP   2 years ago Type 1 diabetes mellitus with hyperglycemia [facility])   [place] [facility] [place], Angeline ORN, NP       Future Appointments             In 2 months Baity, Angeline ORN,  NP [facility], [facility]

## 2023-11-15 LAB — HEMOGLOBIN A1C: Hemoglobin A1C: 7.7

## 2024-01-08 ENCOUNTER — Encounter: Payer: Self-pay | Admitting: Internal Medicine

## 2024-01-09 ENCOUNTER — Encounter: Payer: Self-pay | Admitting: Internal Medicine

## 2024-01-09 ENCOUNTER — Ambulatory Visit: Admitting: Internal Medicine

## 2024-01-09 VITALS — BP 130/68 | HR 92 | Ht 71.0 in | Wt 230.8 lb

## 2024-01-09 DIAGNOSIS — R052 Subacute cough: Secondary | ICD-10-CM

## 2024-01-09 DIAGNOSIS — R0789 Other chest pain: Secondary | ICD-10-CM | POA: Diagnosis not present

## 2024-01-09 DIAGNOSIS — F439 Reaction to severe stress, unspecified: Secondary | ICD-10-CM

## 2024-01-09 NOTE — Progress Notes (Signed)
 Subjective:    Patient ID: Karen Byrd, female    DOB: 09/07/1981, 43 y.o.   MRN: [id number]  HPI  Discussed the use of AI scribe software for clinical note transcription with the patient, who gave verbal consent to proceed.   Karen Byrd is a 43 year old female who presents with chest pressure.  She has been experiencing constant chest pressure for the past three days, described as feeling like someone is standing on her chest. The pressure is located higher than previous chest pains associated with pneumonia and does not radiate to her back, jaw, or arm, although there is a shooting pain that extends to a certain point. She has a history of a pinched nerve, which she mentions in relation to the pain. No symptoms of heartburn or reflux and no engagement in any strenuous activity that could account for muscle strain.  She has had a VSD repair in the past.  Her blood sugar levels were elevated, reaching the 300s, due to malfunctioning insulin in her pump. Since fixing the pump, her blood sugar levels have stabilized, with recent readings of 107 mg/dL upon waking and peaking over 200 mg/dL after meals before returning to normal.  She reports a history of swelling in her legs, for which a bilateral ultrasound was performed, showing no clots. She started taking an anti-inflammatory supplement on Monday, which coincided with the onset of her chest symptoms. She has since stopped taking the supplement.  She has a history of pneumonia diagnosed in early February, which initially presented as a sinus infection. She was treated with one round of antibiotics before the diagnosis of pneumonia was made. During her previous pneumonia, she experienced sharp chest pains, but the current pressure is described as different and less intense.     She reports she has been under a lot of stress lately.  She has been sick since January and just started feeling better about 2 weeks ago.   Review of  Systems   Past Medical History:  Diagnosis Date   Constipation    Depression    Diabetes mellitus    Diabetes mellitus without complication (HCC)    Dysphagia    VSD (ventricular septal defect)     Current Outpatient Medications  Medication Sig Dispense Refill   atorvastatin (LIPITOR) 10 MG tablet Take 1 tablet (10 mg total) by mouth daily. 90 tablet 1   buPROPion (WELLBUTRIN XL) 150 MG 24 hr tablet TAKE ONE TABLET BY MOUTH EVERY DAY 90 tablet 1   Continuous Blood Gluc Sensor (DEXCOM G6 SENSOR) MISC SMARTSIG:Topical Every 10 Days     Continuous Blood Gluc Sensor (DEXCOM G6 SENSOR) MISC Use to monitor blood sugar.  Replace every 10 days     Continuous Blood Gluc Transmit (DEXCOM G6 TRANSMITTER) MISC USE TO MONITOR BLOOD SUGAR, REPLACE EVERY 3 MONTHS     cyclobenzaprine (FLEXERIL) 10 MG tablet Take 1 tablet (10 mg total) by mouth at bedtime as needed for muscle spasms. 30 tablet 0   HUMALOG 100 UNIT/ML injection USE UP TO 70 UNITS DAILY AS DIRECTED VIAPUMP 10 mL 1   Insulin Disposable Pump (OMNIPOD 5 G6 INTRO, GEN 5,) KIT Inject into the skin.     Insulin Disposable Pump (OMNIPOD 5 G6 POD, GEN 5,) MISC Inject into the skin.     linaclotide (LINZESS) 290 MCG CAPS capsule TAKE 1 CAPSULE (290 MCG TOTAL) BY MOUTH ONCE DAILY 90 capsule 0   naproxen (NAPROSYN) 500 MG tablet Take  1 tablet (500 mg total) by mouth 2 (two) times daily with a meal. 14 tablet 0   PARAGARD INTRAUTERINE COPPER IU 1 Device by Intrauterine route. Every 10 years--inserted 2014     venlafaxine XR (EFFEXOR XR) 37.5 MG 24 hr capsule Take 1 capsule (37.5 mg total) by mouth daily with breakfast. 90 capsule 1   No current facility-administered medications for this visit.    Allergies  Allergen Reactions   Sulfa Antibiotics Hives    "massive hives"    Family History  Problem Relation Age of Onset   Hypertension Mother    Diabetes Mother    Hyperlipidemia Father    Hypertension Father    Heart disease Maternal  Grandfather    Diabetes Maternal Grandfather    Stroke Maternal Grandfather    Hyperlipidemia Paternal Grandmother    Heart disease Paternal Grandmother    Hypertension Paternal Grandmother    Diabetes Paternal Grandmother    Stroke Paternal Grandmother    Hyperlipidemia Paternal Grandfather    Hypertension Paternal Grandfather    Stroke Paternal Grandfather    Cancer Maternal Grandmother        leukemia   Anesthesia problems Neg Hx    Hypotension Neg Hx    Malignant hyperthermia Neg Hx    Pseudochol deficiency Neg Hx     Social History   Socioeconomic History   Marital status: Married    Spouse name: Not on file   Number of children: Not on file   Years of education: Not on file   Highest education level: Not on file  Occupational History   Not on file  Tobacco Use   Smoking status: Never   Smokeless tobacco: Never  Vaping Use   Vaping status: Never Used  Substance and Sexual Activity   Alcohol use: Yes    Comment: occasional none last 24hrs   Drug use: No   Sexual activity: Yes    Birth control/protection: I.U.D.  Other Topics Concern   Not on file  Social History Narrative   ** Merged History Encounter **       Social Drivers of Corporate investment banker Strain: Not on file  Food Insecurity: Not on file  Transportation Needs: Not on file  Physical Activity: Not on file  Stress: Not on file  Social Connections: Not on file  Intimate Partner Violence: Not on file     Constitutional: Denies fever, malaise, fatigue, headache or abrupt weight changes.  HEENT: Denies eye pain, eye redness, ear pain, ringing in the ears, wax buildup, runny nose, nasal congestion, bloody nose, or sore throat. Respiratory: Patient reports cough.  Denies difficulty breathing, shortness of breath, or sputum production.   Cardiovascular: Patient reports left-sided chest pressure.  Denies chest pain, palpitations or swelling in the hands or feet.  Gastrointestinal: Patient  reports intermittent constipation.  Denies abdominal pain, bloating, diarrhea or blood in the stool.  GU: Denies urgency, frequency, pain with urination, burning sensation, blood in urine, odor or discharge. Musculoskeletal: Denies decrease in range of motion, difficulty with gait, muscle pain or joint pain and swelling.  Skin: Denies redness, rashes, lesions or ulcercations.  Neurological: Denies dizziness, difficulty with memory, difficulty with speech or problems with balance and coordination.  Psych: Patient reports stress, has a history of anxiety and depression.  Denies SI/HI.  No other specific complaints in a complete review of systems (except as listed in HPI above).      Objective:   Physical Exam  BP  130/68 (BP Location: Right Arm, Patient Position: Sitting, Cuff Size: Normal)   Pulse 92   Ht 5\' 11"  (1.803 m)   Wt 230 lb 12.8 oz (104.7 kg)   SpO2 99%   BMI 32.19 kg/m   Wt Readings from Last 3 Encounters:  07/15/23 227 lb (103 kg)  12/26/22 224 lb (101.6 kg)  05/18/22 223 lb (101.2 kg)    General: Appears her stated age, obese in NAD. Skin: Warm, dry and intact. No rashes noted. Cardiovascular: Normal rate and rhythm. S1,S2 noted. Murmur noted.  Pulmonary/Chest: Normal effort and positive vesicular breath sounds. No respiratory distress. No wheezes, rales or ronchi noted.  Musculoskeletal: Chest wall nontender with palpation.  No difficulty with gait.  Neurological: Alert and oriented.  Psychiatric: Tearful.  Judgment and thought content normal.    BMET    Component Value Date/Time   NA 138 07/15/2023 1124   K 4.5 07/15/2023 1124   CL 101 07/15/2023 1124   CO2 28 07/15/2023 1124   GLUCOSE 225 (H) 07/15/2023 1124   BUN 11 07/15/2023 1124   CREATININE 0.64 07/15/2023 1124   CALCIUM 9.5 07/15/2023 1124   GFRNONAA >60 03/24/2020 1030   GFRAA >60 03/24/2020 1030    Lipid Panel     Component Value Date/Time   CHOL 251 (H) 07/15/2023 1124   TRIG 75  07/15/2023 1124   HDL 96 07/15/2023 1124   CHOLHDL 2.6 07/15/2023 1124   VLDL 32.2 04/06/2020 1542   LDLCALC 138 (H) 07/15/2023 1124    CBC    Component Value Date/Time   WBC 8.3 07/15/2023 1124   RBC 4.18 07/15/2023 1124   HGB 13.3 07/15/2023 1124   HCT 40.9 07/15/2023 1124   PLT 301 07/15/2023 1124   MCV 97.8 07/15/2023 1124   MCH 31.8 07/15/2023 1124   MCHC 32.5 07/15/2023 1124   RDW 11.8 07/15/2023 1124    Hgb A1C Lab Results  Component Value Date   HGBA1C 7.3 (H) 07/15/2023            Assessment & Plan:  Assessment and Plan    Chest pressure: Constant chest pressure for three days, more intense than previous pneumonia-related pain. Considered cardiac causes due to atypical presentation in women and potential residual effects from recent pneumonia. History of open heart surgery, no current heartburn or reflux symptoms. -Indication for ECG: Chest pressure -Interpretation of ECG: Normal rate and rhythm, LVH -Comparison of ECG: 2021, new onset LVH.  Cough: Intermittent, lung sounds clear. -Offered chest x-ray for further evaluation to rule out underlying pneumonia however she declines at this time given normal lung exam -Offered cough suppressants however she declined  Stress: -Encouraged stress reduction techniques  ER precautions discussed  RTC in 1 week for follow-up of chronic conditions Nicki Reaper, NP

## 2024-01-09 NOTE — Patient Instructions (Signed)
Nonspecific Chest Pain Chest pain can be caused by many different conditions. Some causes of chest pain can be life-threatening. These will require treatment right away. Serious causes of chest pain include: Heart attack. A tear in the body's main blood vessel. Redness and swelling (inflammation) around your heart. Blood clot in your lungs. Other causes of chest pain may not be so serious. These include: Heartburn. Anxiety or stress. Damage to bones or muscles in your chest. Lung infections. Chest pain can feel like: Pain or discomfort in your chest. Crushing, pressure, aching, or squeezing pain. Burning or tingling. Dull or sharp pain that is worse when you move, cough, or take a deep breath. Pain or discomfort that is also felt in your back, neck, jaw, shoulder, or arm, or pain that spreads to any of these areas. It is hard to know whether your pain is caused by something that is serious or something that is not so serious. So it is important to see your doctor right away if you have chest pain. Follow these instructions at home: Medicines Take over-the-counter and prescription medicines only as told by your doctor. If you were prescribed an antibiotic medicine, take it as told by your doctor. Do not stop taking the antibiotic even if you start to feel better. Lifestyle  Rest as told by your doctor. Do not use any products that contain nicotine or tobacco, such as cigarettes, e-cigarettes, and chewing tobacco. If you need help quitting, ask your doctor. Do not drink alcohol. Make lifestyle changes as told by your doctor. These may include: Getting regular exercise. Ask your doctor what activities are safe for you. Eating a heart-healthy diet. A diet and nutrition specialist (dietitian) can help you to learn healthy eating options. Staying at a healthy weight. Treating diabetes or high blood pressure, if needed. Lowering your stress. Activities such as yoga and relaxation techniques  can help. General instructions Pay attention to any changes in your symptoms. Tell your doctor about them or any new symptoms. Avoid any activities that cause chest pain. Keep all follow-up visits as told by your doctor. This is important. You may need more testing if your chest pain does not go away. Contact a doctor if: Your chest pain does not go away. You feel depressed. You have a fever. Get help right away if: Your chest pain is worse. You have a cough that gets worse, or you cough up blood. You have very bad (severe) pain in your belly (abdomen). You pass out (faint). You have either of these for no clear reason: Sudden chest discomfort. Sudden discomfort in your arms, back, neck, or jaw. You have shortness of breath at any time. You suddenly start to sweat, or your skin gets clammy. You feel sick to your stomach (nauseous). You throw up (vomit). You suddenly feel lightheaded or dizzy. You feel very weak or tired. Your heart starts to beat fast, or it feels like it is skipping beats. These symptoms may be an emergency. Do not wait to see if the symptoms will go away. Get medical help right away. Call your local emergency services (911 in the [place] Do not drive yourself to the hospital. Summary Chest pain can be caused by many different conditions. The cause may be serious and need treatment right away. If you have chest pain, see your doctor right away. Follow your doctor's instructions for taking medicines and making lifestyle changes. Keep all follow-up visits as told by your doctor. This includes visits for any further   testing if your chest pain does not go away. Be sure to know the signs that show that your condition has become worse. Get help right away if you have these symptoms. This information is not intended to replace advice given to you by your health care provider. Make sure you discuss any questions you have with your health care provider. Document Revised:  08/23/2022 Document Reviewed: 08/23/2022 Elsevier Patient Education  2024 [employer]  

## 2024-01-14 ENCOUNTER — Ambulatory Visit: Payer: Self-pay | Admitting: Internal Medicine

## 2024-01-14 NOTE — Progress Notes (Deleted)
 Subjective:    Patient ID: Karen Byrd, female    DOB: 02/11/1981, 43 y.o.   MRN: [id number]  HPI  Patient presents to clinic today for follow-up of chronic conditions.  IBS: Mainly constipation.  She takes linzess as needed with good relief of symptoms.  Colonoscopy from 04/2018 reviewed.  GERD: Currently not an issue.  She is not taking any medications for this.  Upper GI from 04/2018 reviewed.  DM1: Her last A1c was 7.3%, 10/2023.  She has an insulin pump with humalog.  She checks her feet routinely.  Her last eye exam was 04/2023.  Flu 06/2023.  Pneumovax 08/2021.  COVID Moderna x 3.  She follows with endocrinology.  Anxiety and depression: Chronic, but she is not currently taking any medications for this but has been on sertraline in the past.  She has been having a lot of work related stress. She is not currently seeing a therapist.  She denies SI/HI.  HLD: Her last LDL was 138, triglycerides 75, 06/2023.  She denies myalgias on atorvastatin.  She does not consume low-fat diet.   Review of Systems     Past Medical History:  Diagnosis Date   Constipation    Depression    Diabetes mellitus    Diabetes mellitus without complication (HCC)    Dysphagia    VSD (ventricular septal defect)     Current Outpatient Medications  Medication Sig Dispense Refill   atorvastatin (LIPITOR) 10 MG tablet Take 1 tablet (10 mg total) by mouth daily. 90 tablet 1   Continuous Blood Gluc Sensor (DEXCOM G6 SENSOR) MISC SMARTSIG:Topical Every 10 Days     Insulin Disposable Pump (OMNIPOD 5 G6 INTRO, GEN 5,) KIT Inject into the skin.     Insulin Disposable Pump (OMNIPOD 5 G6 POD, GEN 5,) MISC Inject into the skin.     linaclotide (LINZESS) 290 MCG CAPS capsule TAKE 1 CAPSULE (290 MCG TOTAL) BY MOUTH ONCE DAILY 90 capsule 0   PARAGARD INTRAUTERINE COPPER IU 1 Device by Intrauterine route. Every 10 years--inserted 2014     No current facility-administered medications for this visit.    Allergies   Allergen Reactions   Sulfa Antibiotics Hives    "massive hives"    Family History  Problem Relation Age of Onset   Hypertension Mother    Diabetes Mother    Hyperlipidemia Father    Hypertension Father    Heart disease Maternal Grandfather    Diabetes Maternal Grandfather    Stroke Maternal Grandfather    Hyperlipidemia Paternal Grandmother    Heart disease Paternal Grandmother    Hypertension Paternal Grandmother    Diabetes Paternal Grandmother    Stroke Paternal Grandmother    Hyperlipidemia Paternal Grandfather    Hypertension Paternal Grandfather    Stroke Paternal Grandfather    Cancer Maternal Grandmother        leukemia   Anesthesia problems Neg Hx    Hypotension Neg Hx    Malignant hyperthermia Neg Hx    Pseudochol deficiency Neg Hx     Social History   Socioeconomic History   Marital status: Married    Spouse name: Not on file   Number of children: Not on file   Years of education: Not on file   Highest education level: Not on file  Occupational History   Not on file  Tobacco Use   Smoking status: Never   Smokeless tobacco: Never  Vaping Use   Vaping status: Never Used  Substance  and Sexual Activity   Alcohol use: Yes    Comment: occasional none last 24hrs   Drug use: No   Sexual activity: Yes    Birth control/protection: I.U.D.  Other Topics Concern   Not on file  Social History Narrative   ** Merged History Encounter **       Social Drivers of Corporate investment banker Strain: Not on file  Food Insecurity: Not on file  Transportation Needs: Not on file  Physical Activity: Not on file  Stress: Not on file  Social Connections: Not on file  Intimate Partner Violence: Not on file     Constitutional: Denies fever, malaise, fatigue, headache or abrupt weight changes.  HEENT: Denies eye pain, eye redness, ear pain, ringing in the ears, wax buildup, runny nose, nasal congestion, bloody nose, or sore throat. Respiratory: Denies difficulty  breathing, shortness of breath, cough or sputum production.   Cardiovascular: Denies chest pain, chest tightness, palpitations or swelling in the hands or feet.  Gastrointestinal: Patient reports constipation.  Denies abdominal pain, bloating, diarrhea or blood in the stool.  GU: Denies urgency, frequency, pain with urination, burning sensation, blood in urine, odor or discharge. Musculoskeletal: Denies decrease in range of motion, difficulty with gait, muscle pain or joint pain and swelling.  Skin: Denies redness, rashes, lesions or ulcercations.  Neurological: Denies dizziness, difficulty with memory, difficulty with speech or problems with balance and coordination.  Psych: Patient has a history of anxiety and depression.  Denies SI/HI.  No other specific complaints in a complete review of systems (except as listed in HPI above).  Objective:   Physical Exam    There were no vitals taken for this visit.  Wt Readings from Last 3 Encounters:  01/09/24 230 lb 12.8 oz (104.7 kg)  07/15/23 227 lb (103 kg)  12/26/22 224 lb (101.6 kg)    General: Appears her stated age, obese, in NAD. Skin: Warm, dry and intact. No ulcerations noted. HEENT: Head: normal shape and size; Eyes: sclera white, no icterus, conjunctiva pink, PERRLA and EOMs intact;  Cardiovascular: Normal rate and rhythm. S1,S2 noted.  No murmur, rubs or gallops noted.  Pulmonary/Chest: Normal effort and positive vesicular breath sounds. No respiratory distress. No wheezes, rales or ronchi noted.  Abdomen: Soft and nontender. Normal bowel sounds.  Musculoskeletal:  No difficulty with gait.  Neurological: Alert and oriented.  Coordination normal.  Psychiatric: Tearful. Behavior is normal. Judgment and thought content normal.     BMET    Component Value Date/Time   NA 138 07/15/2023 1124   K 4.5 07/15/2023 1124   CL 101 07/15/2023 1124   CO2 28 07/15/2023 1124   GLUCOSE 225 (H) 07/15/2023 1124   BUN 11 07/15/2023 1124    CREATININE 0.64 07/15/2023 1124   CALCIUM 9.5 07/15/2023 1124   GFRNONAA >60 03/24/2020 1030   GFRAA >60 03/24/2020 1030    Lipid Panel     Component Value Date/Time   CHOL 251 (H) 07/15/2023 1124   TRIG 75 07/15/2023 1124   HDL 96 07/15/2023 1124   CHOLHDL 2.6 07/15/2023 1124   VLDL 32.2 04/06/2020 1542   LDLCALC 138 (H) 07/15/2023 1124    CBC    Component Value Date/Time   WBC 8.3 07/15/2023 1124   RBC 4.18 07/15/2023 1124   HGB 13.3 07/15/2023 1124   HCT 40.9 07/15/2023 1124   PLT 301 07/15/2023 1124   MCV 97.8 07/15/2023 1124   MCH 31.8 07/15/2023 1124   MCHC  32.5 07/15/2023 1124   RDW 11.8 07/15/2023 1124    Hgb A1C Lab Results  Component Value Date   HGBA1C 7.3 (H) 07/15/2023          Assessment & Plan:     RTC in 6 months for your annual exam Nicki Reaper, NP

## 2024-01-15 ENCOUNTER — Ambulatory Visit: Admitting: Internal Medicine

## 2024-01-15 ENCOUNTER — Encounter: Payer: Self-pay | Admitting: Internal Medicine

## 2024-01-15 VITALS — BP 118/70 | Ht 71.0 in | Wt 230.6 lb

## 2024-01-15 DIAGNOSIS — F419 Anxiety disorder, unspecified: Secondary | ICD-10-CM

## 2024-01-15 DIAGNOSIS — K581 Irritable bowel syndrome with constipation: Secondary | ICD-10-CM

## 2024-01-15 DIAGNOSIS — Z794 Long term (current) use of insulin: Secondary | ICD-10-CM

## 2024-01-15 DIAGNOSIS — K219 Gastro-esophageal reflux disease without esophagitis: Secondary | ICD-10-CM | POA: Diagnosis not present

## 2024-01-15 DIAGNOSIS — E785 Hyperlipidemia, unspecified: Secondary | ICD-10-CM

## 2024-01-15 DIAGNOSIS — E1059 Type 1 diabetes mellitus with other circulatory complications: Secondary | ICD-10-CM

## 2024-01-15 DIAGNOSIS — E66811 Obesity, class 1: Secondary | ICD-10-CM

## 2024-01-15 DIAGNOSIS — E6609 Other obesity due to excess calories: Secondary | ICD-10-CM

## 2024-01-15 DIAGNOSIS — F32A Depression, unspecified: Secondary | ICD-10-CM

## 2024-01-15 DIAGNOSIS — E1069 Type 1 diabetes mellitus with other specified complication: Secondary | ICD-10-CM

## 2024-01-15 DIAGNOSIS — Z6832 Body mass index (BMI) 32.0-32.9, adult: Secondary | ICD-10-CM

## 2024-01-15 NOTE — Assessment & Plan Note (Signed)
 Encouraged diet and exercise for weight loss ?

## 2024-01-15 NOTE — Assessment & Plan Note (Signed)
 Persistent issues but she does not want medication therapy at this time Support offered

## 2024-01-15 NOTE — Assessment & Plan Note (Signed)
 Avoid eating and laying down Okay to take Tums OTC as needed We will monitor

## 2024-01-15 NOTE — Progress Notes (Signed)
 Subjective:    Patient ID: Karen Byrd, female    DOB: Mar 04, 1981, 43 y.o.   MRN: [id number]  HPI  Patient presents to clinic today for follow-up of chronic conditions.  IBS: Mainly constipation.  She takes linzess as needed with good relief of symptoms.  Colonoscopy from 04/2018 reviewed.  GERD: Often occurs at night.  She is not taking any medications for this.  Upper GI from 04/2018 reviewed.  DM1: Her last A1c was 7.3%, 10/2023.  She has an insulin pump with humalog. Her sugars range 60-350. She checks her feet routinely.  Her last eye exam was 04/2023.  Flu 06/2023.  Pneumovax 08/2021.  COVID Moderna x 3.  She follows with endocrinology.  Anxiety and depression: Chronic, but she is not currently taking any medications for this but has been on sertraline in the past.  She has been having a lot of work related stress. She is not currently seeing a therapist.  She denies SI/HI.  HLD: Her last LDL was 138, triglycerides 75, 06/2023.  She denies myalgias on atorvastatin.  She does not consume low-fat diet.   Review of Systems     Past Medical History:  Diagnosis Date   Constipation    Depression    Diabetes mellitus    Diabetes mellitus without complication (HCC)    Dysphagia    VSD (ventricular septal defect)     Current Outpatient Medications  Medication Sig Dispense Refill   atorvastatin (LIPITOR) 10 MG tablet Take 1 tablet (10 mg total) by mouth daily. 90 tablet 1   Continuous Blood Gluc Sensor (DEXCOM G6 SENSOR) MISC SMARTSIG:Topical Every 10 Days     Insulin Disposable Pump (OMNIPOD 5 G6 INTRO, GEN 5,) KIT Inject into the skin.     Insulin Disposable Pump (OMNIPOD 5 G6 POD, GEN 5,) MISC Inject into the skin.     linaclotide (LINZESS) 290 MCG CAPS capsule TAKE 1 CAPSULE (290 MCG TOTAL) BY MOUTH ONCE DAILY 90 capsule 0   PARAGARD INTRAUTERINE COPPER IU 1 Device by Intrauterine route. Every 10 years--inserted 2014     No current facility-administered medications for this  visit.    Allergies  Allergen Reactions   Sulfa Antibiotics Hives    "massive hives"    Family History  Problem Relation Age of Onset   Hypertension Mother    Diabetes Mother    Hyperlipidemia Father    Hypertension Father    Heart disease Maternal Grandfather    Diabetes Maternal Grandfather    Stroke Maternal Grandfather    Hyperlipidemia Paternal Grandmother    Heart disease Paternal Grandmother    Hypertension Paternal Grandmother    Diabetes Paternal Grandmother    Stroke Paternal Grandmother    Hyperlipidemia Paternal Grandfather    Hypertension Paternal Grandfather    Stroke Paternal Grandfather    Cancer Maternal Grandmother        leukemia   Anesthesia problems Neg Hx    Hypotension Neg Hx    Malignant hyperthermia Neg Hx    Pseudochol deficiency Neg Hx     Social History   Socioeconomic History   Marital status: Married    Spouse name: Not on file   Number of children: Not on file   Years of education: Not on file   Highest education level: Not on file  Occupational History   Not on file  Tobacco Use   Smoking status: Never   Smokeless tobacco: Never  Vaping Use   Vaping status: Never  Used  Substance and Sexual Activity   Alcohol use: Yes    Comment: occasional none last 24hrs   Drug use: No   Sexual activity: Yes    Birth control/protection: I.U.D.  Other Topics Concern   Not on file  Social History Narrative   ** Merged History Encounter **       Social Drivers of Corporate investment banker Strain: Not on file  Food Insecurity: Not on file  Transportation Needs: Not on file  Physical Activity: Not on file  Stress: Not on file  Social Connections: Not on file  Intimate Partner Violence: Not on file     Constitutional: Denies fever, malaise, fatigue, headache or abrupt weight changes.  HEENT: Denies eye pain, eye redness, ear pain, ringing in the ears, wax buildup, runny nose, nasal congestion, bloody nose, or sore  throat. Respiratory: Pt reports cough. Denies difficulty breathing, shortness of breath, or sputum production.   Cardiovascular: Pt reports chest pressure. Denies chest pain, palpitations or swelling in the hands or feet.  Gastrointestinal: Patient reports constipation.  Denies abdominal pain, bloating, diarrhea or blood in the stool.  GU: Denies urgency, frequency, pain with urination, burning sensation, blood in urine, odor or discharge. Musculoskeletal: Denies decrease in range of motion, difficulty with gait, muscle pain or joint pain and swelling.  Skin: Denies redness, rashes, lesions or ulcercations.  Neurological: Denies dizziness, difficulty with memory, difficulty with speech or problems with balance and coordination.  Psych: Patient has a history of anxiety and depression.  Denies SI/HI.  No other specific complaints in a complete review of systems (except as listed in HPI above).  Objective:   Physical Exam BP 118/70 (BP Location: Left Arm, Patient Position: Sitting, Cuff Size: Normal)   Ht 5\' 11"  (1.803 m)   Wt 230 lb 9.6 oz (104.6 kg)   BMI 32.16 kg/m    Wt Readings from Last 3 Encounters:  01/09/24 230 lb 12.8 oz (104.7 kg)  07/15/23 227 lb (103 kg)  12/26/22 224 lb (101.6 kg)    General: Appears her stated age, obese, in NAD. Skin: Warm, dry and intact. PVD changes noted of BLE without ulcerations noted. HEENT: Head: normal shape and size; Eyes: sclera white, no icterus, conjunctiva pink, PERRLA and EOMs intact;  Cardiovascular: Normal rate and rhythm. S1,S2 noted.  No murmur, rubs or gallops noted.  Pulmonary/Chest: Normal effort and positive vesicular breath sounds. No respiratory distress. No wheezes, rales or ronchi noted.  Abdomen: Soft and nontender. Normal bowel sounds.  Musculoskeletal:  No difficulty with gait.  Neurological: Alert and oriented.  Coordination normal.  Psychiatric: Tearful. Behavior is normal. Judgment and thought content normal.      BMET    Component Value Date/Time   NA 138 07/15/2023 1124   K 4.5 07/15/2023 1124   CL 101 07/15/2023 1124   CO2 28 07/15/2023 1124   GLUCOSE 225 (H) 07/15/2023 1124   BUN 11 07/15/2023 1124   CREATININE 0.64 07/15/2023 1124   CALCIUM 9.5 07/15/2023 1124   GFRNONAA >60 03/24/2020 1030   GFRAA >60 03/24/2020 1030    Lipid Panel     Component Value Date/Time   CHOL 251 (H) 07/15/2023 1124   TRIG 75 07/15/2023 1124   HDL 96 07/15/2023 1124   CHOLHDL 2.6 07/15/2023 1124   VLDL 32.2 04/06/2020 1542   LDLCALC 138 (H) 07/15/2023 1124    CBC    Component Value Date/Time   WBC 8.3 07/15/2023 1124   RBC  4.18 07/15/2023 1124   HGB 13.3 07/15/2023 1124   HCT 40.9 07/15/2023 1124   PLT 301 07/15/2023 1124   MCV 97.8 07/15/2023 1124   MCH 31.8 07/15/2023 1124   MCHC 32.5 07/15/2023 1124   RDW 11.8 07/15/2023 1124    Hgb A1C Lab Results  Component Value Date   HGBA1C 7.3 (H) 07/15/2023          Assessment & Plan:     RTC in 6 months for your annual exam Nicki Reaper, NP

## 2024-01-15 NOTE — Patient Instructions (Signed)
 Managing Stress, Adult Feeling a certain amount of stress is normal. Stress helps our body and mind get ready to deal with the demands of life. Stress hormones can motivate you to do well at work and meet your responsibilities. But severe or long-term (chronic) stress can affect your mental and physical health. Chronic stress puts you at higher risk for: Anxiety and depression. Other health problems such as digestive problems, muscle aches, heart disease, high blood pressure, and stroke. What are the causes? Common causes of stress include: Demands from work, such as deadlines, feeling overworked, or having long hours. Pressures at home, such as money issues, disagreements with a spouse, or parenting issues. Pressures from major life changes, such as divorce, moving, loss of a loved one, or chronic illness. You may be at higher risk for stress-related problems if you: Do not get enough sleep. Are in poor health. Do not have emotional support. Have a mental health disorder such as anxiety or depression. How to recognize stress Stress can make you: Have trouble sleeping. Feel sad, anxious, irritable, or overwhelmed. Lose your appetite. Overeat or want to eat unhealthy foods. Want to use drugs or alcohol. Stress can also cause physical symptoms, such as: Sore, tense muscles, especially in the shoulders and neck. Headaches. Trouble breathing. A faster heart rate. Stomach pain, nausea, or vomiting. Diarrhea or constipation. Trouble concentrating. Follow these instructions at home: Eating and drinking Eat a healthy diet. This includes: Eating foods that are high in fiber, such as beans, whole grains, and fresh fruits and vegetables. Limiting foods that are high in fat and processed sugars, such as fried or sweet foods. Do not skip meals or overeat. Drink enough fluid to keep your urine pale yellow. Alcohol use Do not drink alcohol if: Your health care provider tells you not to  drink. You are pregnant, may be pregnant, or are planning to become pregnant. Drinking alcohol is a way some people try to ease their stress. This can be dangerous, so if you drink alcohol: Limit how much you have to: 0-1 drink a day for women. 0-2 drinks a day for men. Know how much alcohol is in your drink. In the [place] one drink equals one 12 oz bottle of beer (355 mL), one 5 oz glass of wine (148 mL), or one 1 oz glass of hard liquor (44 mL). Activity  Include 30 minutes of exercise in your daily schedule. Exercise is a good stress reducer. Include time in your day for an activity that you find relaxing. Try taking a walk, going on a bike ride, reading a book, or listening to music. Schedule your time in a way that lowers stress, and keep a regular schedule. Focus on doing what is most important to get done. Lifestyle Identify the source of your stress and your reaction to it. See a therapist who can help you change unhelpful reactions. When there are stressful events: Talk about them with family, friends, or coworkers. Try to think realistically about stressful events and not ignore them or overreact. Try to find the positives in a stressful situation and not focus on the negatives. Cut back on responsibilities at work and home, if possible. Ask for help from friends or family members if you need it. Find ways to manage stress, such as: Mindfulness, meditation, or deep breathing. Yoga or tai chi. Progressive muscle relaxation. Spending time in nature. Doing art, playing music, or reading. Making time for fun activities. Spending time with family and friends. Get support  from family, friends, or spiritual resources. General instructions Get enough sleep. Try to go to sleep and get up at about the same time every day. Take over-the-counter and prescription medicines only as told by your health care provider. Do not use any products that contain nicotine or tobacco. These products  include cigarettes, chewing tobacco, and vaping devices, such as e-cigarettes. If you need help quitting, ask your health care provider. Do not use drugs or smoke to deal with stress. Keep all follow-up visits. This is important. Where to find support Talk with your health care provider about stress management or finding a support group. Find a therapist to work with you on your stress management techniques. Where to find more information [employer] on Mental Illness: www.nami.org American Psychological Association: [employer].ca Contact a health care provider if: Your stress symptoms get worse. You are unable to manage your stress at home. You are struggling to stop using drugs or alcohol. Get help right away if: You may be a danger to yourself or others. You have any thoughts of death or suicide. Get help right awayif you feel like you may hurt yourself or others, or have thoughts about taking your own life. Go to your nearest emergency room or: Call 911. Call the National Suicide Prevention Lifeline at (304)678-3299 or 988 in the [place] This is open 24 hours a day. If you're a Veteran: Call 988 and press 1. This is open 24 hours a day. Text the [employer] at 226-132-3352. Summary Feeling a certain amount of stress is normal, but severe or long-term (chronic) stress can affect your mental and physical health. Chronic stress can put you at higher risk for anxiety, depression, and other health problems such as digestive problems, muscle aches, heart disease, high blood pressure, and stroke. You may be at higher risk for stress-related problems if you do not get enough sleep, are in poor health, lack emotional support, or have a mental health disorder such as anxiety or depression. Identify the source of your stress and your reaction to it. Try talking about stressful events with family, friends, or coworkers, finding a coping method, or getting support from spiritual resources. If  you need more help, talk with your health care provider about finding a support group or a mental health therapist. This information is not intended to replace advice given to you by your health care provider. Make sure you discuss any questions you have with your health care provider. Document Revised: 05/23/2023 Document Reviewed: 05/02/2021 Elsevier Patient Education  2024 [employer].

## 2024-01-15 NOTE — Assessment & Plan Note (Signed)
 C-Met and lipid profile today Encouraged her to consume low-fat diet

## 2024-01-15 NOTE — Assessment & Plan Note (Signed)
Encouraged high-fiber diet and adequate water intake Continue Linzess 

## 2024-01-15 NOTE — Assessment & Plan Note (Signed)
 A1c reviewed, urine microalbumin today Encouraged her to consume a low-carb diet Continue humalog insulin pump Encourage routine eye exam Encouraged routine foot exam Encouraged her to get a flu shot in the fall Pneumovax UTD COVID-vaccine UTD

## 2024-01-16 ENCOUNTER — Encounter: Payer: Self-pay | Admitting: Internal Medicine

## 2024-01-16 LAB — LIPID PANEL
Cholesterol: 215 mg/dL — ABNORMAL HIGH (ref <200)
HDL: 95 mg/dL (ref >=50)
LDL Cholesterol (Calc): 104 mg/dL — ABNORMAL HIGH
Non-HDL Cholesterol (Calc): 120 mg/dL (ref <130)
Total CHOL/HDL Ratio: 2.3 (calc) (ref <5.0)
Triglycerides: 71 mg/dL (ref <150)

## 2024-01-16 LAB — MICROALBUMIN / CREATININE URINE RATIO
Creatinine, Urine: 171 mg/dL (ref 20–275)
Microalb Creat Ratio: 2 mg/g{creat} (ref <30)
Microalb, Ur: 0.4 mg/dL

## 2024-01-21 ENCOUNTER — Other Ambulatory Visit: Payer: Self-pay

## 2024-01-21 MED ORDER — ATORVASTATIN CALCIUM 20 MG PO TABS: 20.0000 mg | ORAL_TABLET | Freq: Every day | ORAL | 0 refills | Status: DC

## 2024-02-03 ENCOUNTER — Other Ambulatory Visit: Payer: Self-pay | Admitting: Internal Medicine

## 2024-02-04 NOTE — Telephone Encounter (Signed)
 D/C 01/21/24. Requested Prescriptions  Refused Prescriptions Disp Refills   atorvastatin (LIPITOR) 10 MG tablet [Pharmacy Med Name: ATORVASTATIN CALCIUM 10 MG TAB] 90 tablet 1    Sig: TAKE ONE TABLET BY MOUTH EVERY DAY     Cardiovascular:  Antilipid - Statins Failed - 02/04/2024 10:28 AM      Failed - Lipid Panel in normal range within the last 12 months    Cholesterol  Date Value Ref Range Status  01/15/2024 215 (H) <200 mg/dL Final   LDL Cholesterol (Calc)  Date Value Ref Range Status  01/15/2024 104 (H) mg/dL (calc) Final    Comment:    Reference range: <100 . Desirable range <100 mg/dL for primary prevention;   <70 mg/dL for patients with CHD or diabetic patients  with > or = 2 CHD risk factors. Aaron Aas LDL-C is now calculated using the Martin-Hopkins  calculation, which is a validated novel method providing  better accuracy than the Friedewald equation in the  estimation of LDL-C.  Melinda Sprawls et al. Erroll Heard. 4098;119(14): 2061-2068  (http://education.[employer].com/faq/FAQ164)    HDL  Date Value Ref Range Status  01/15/2024 95 > OR = 50 mg/dL Final   Triglycerides  Date Value Ref Range Status  01/15/2024 71 <150 mg/dL Final         Passed - Patient is not pregnant      Passed - Valid encounter within last 12 months    Recent Outpatient Visits           2 weeks ago Type 1 diabetes mellitus with other circulatory complication [facility])   [place] [facility] [place], Rankin Buzzard, NP   3 weeks ago Subacute cough   [place] [facility] [place], Rankin Buzzard, [place]

## 2024-03-05 IMAGING — US US EXTREM UP*L* LTD
1 series · 10 of 10 positions shown · non-contrast
Comparison: None.

CLINICAL DATA: Mass on the left forearm for 1 year

EXAM:
ULTRASOUND LEFT UPPER EXTREMITY LIMITED
TECHNIQUE: Ultrasound examination of the upper extremity soft tissues was
performed in the area of clinical concern.

[Series 2: us extrem up*left* ltd · 0.05mm/px · 10 of 10 slices shown]
[im 1/10]
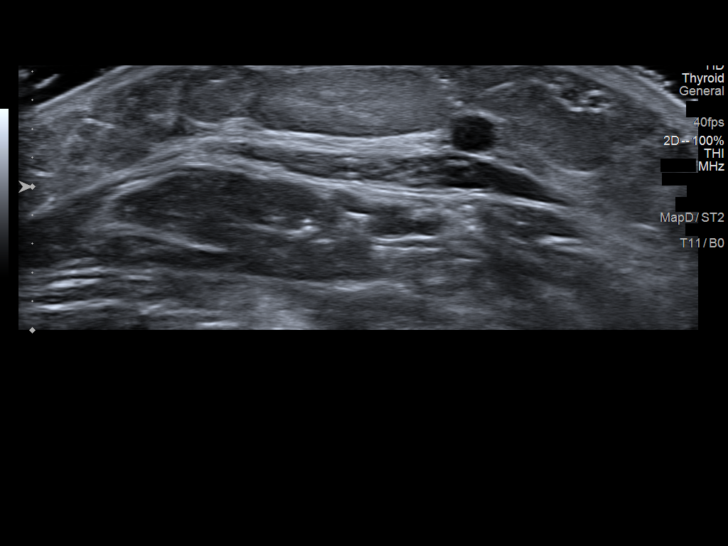
[im 2/10]
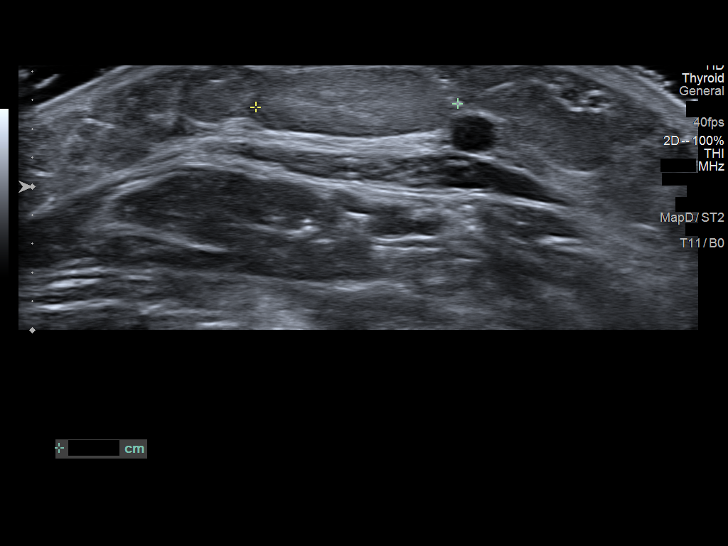
[im 3/10]
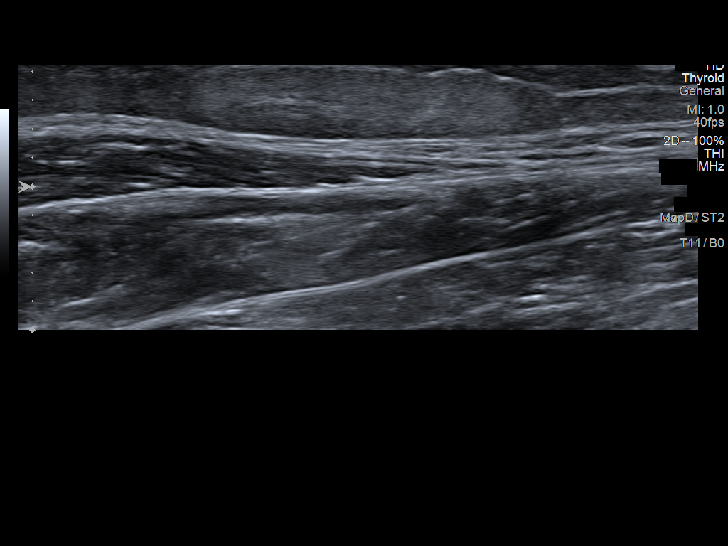
[im 4/10]
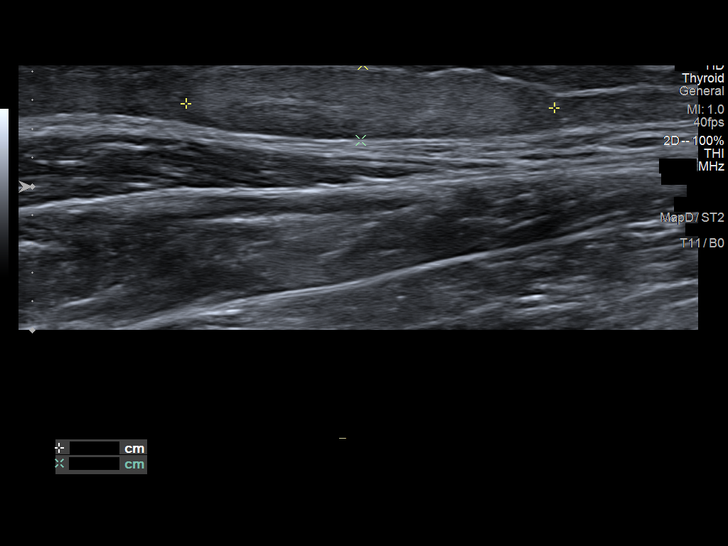
[im 5/10]
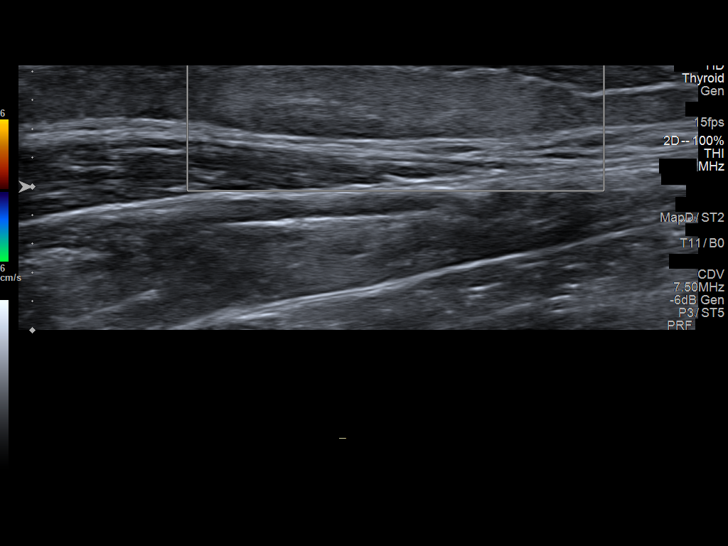
[im 6/10]
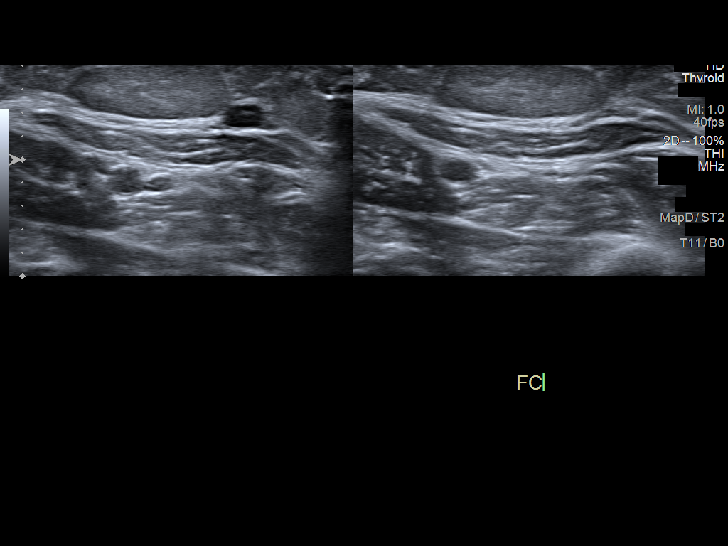
[im 7/10]
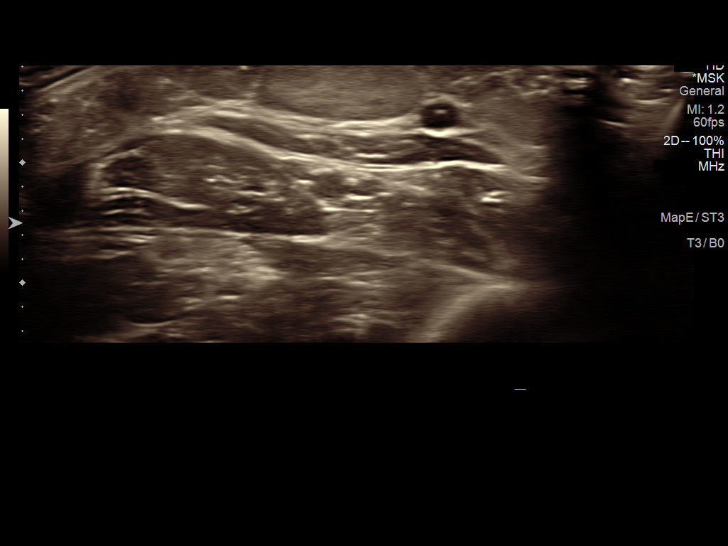
[im 8/10]
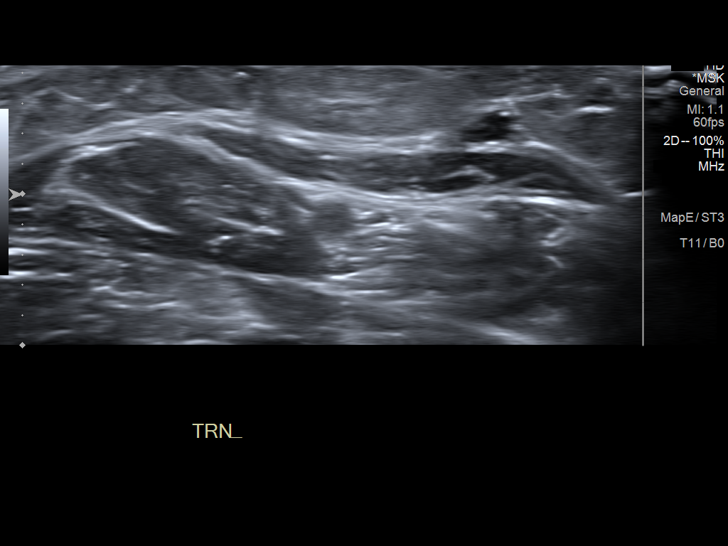
[im 9/10]
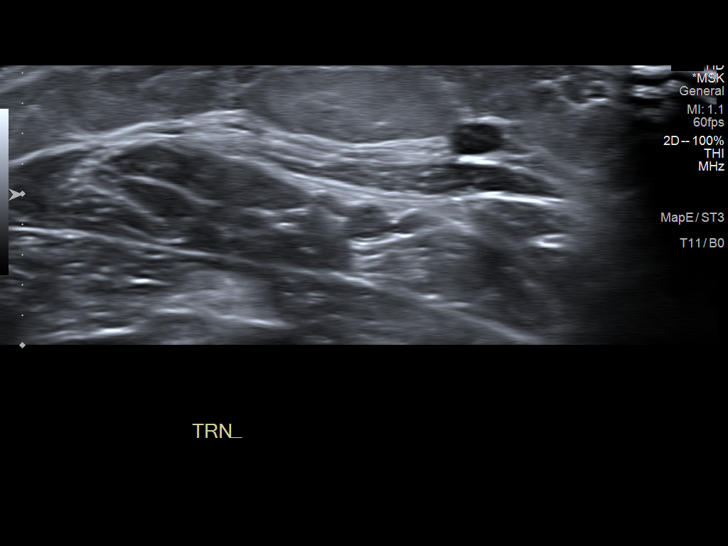
[im 10/10]
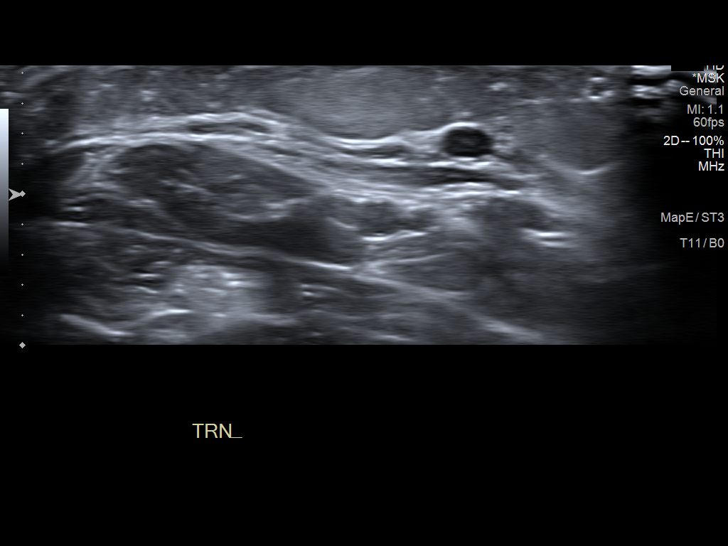

[10 of 10 positions shown; findings below may reference images not displayed]

FINDINGS: 2.6 x 1.4 x 0.5 cm echogenic mass in the subcutaneous fat along the
volar aspect of the left forearm at the site of clinical palpable
abnormality without internal Doppler flow. No posterior acoustic
enhancement or shadowing. No architectural distortion.

No other solid or cystic mass.  No fluid collection or hematoma.
IMPRESSION: 1. 2.6 x 1.4 x 0.5 cm echogenic mass in the subcutaneous fat of the
left forearm at the site of clinical palpable abnormality likely
reflects a lipoma.

## 2024-04-20 LAB — HM MAMMOGRAPHY

## 2024-06-29 ENCOUNTER — Encounter: Payer: Self-pay | Admitting: Internal Medicine

## 2024-07-06 ENCOUNTER — Encounter: Payer: Self-pay | Admitting: Internal Medicine

## 2024-07-06 ENCOUNTER — Ambulatory Visit (INDEPENDENT_AMBULATORY_CARE_PROVIDER_SITE_OTHER): Admitting: Internal Medicine

## 2024-07-06 VITALS — BP 122/74 | Ht 71.0 in | Wt 224.8 lb

## 2024-07-06 DIAGNOSIS — N926 Irregular menstruation, unspecified: Secondary | ICD-10-CM | POA: Diagnosis not present

## 2024-07-06 DIAGNOSIS — R232 Flushing: Secondary | ICD-10-CM | POA: Diagnosis not present

## 2024-07-06 DIAGNOSIS — G4701 Insomnia due to medical condition: Secondary | ICD-10-CM

## 2024-07-06 DIAGNOSIS — N951 Menopausal and female climacteric states: Secondary | ICD-10-CM | POA: Diagnosis not present

## 2024-07-06 DIAGNOSIS — N3946 Mixed incontinence: Secondary | ICD-10-CM

## 2024-07-06 NOTE — Patient Instructions (Signed)
 Perimenopause: What to Know Perimenopause is the time in your life when your levels of estrogen start to go down. Estrogen is the female hormone made by your ovaries. Perimenopause can start 2-8 years before menopause. It can cause changes to your menstrual period. During this time, your ovaries may or may not make an egg. In many cases, you can still get pregnant. What are the causes? Perimenopause is a natural change in your homone levels that happens as you get older. What increases the risk? You're more likely to start perimenopause early if: You have an abnormal growth (tumor) of the pituitary gland in your brain. You have a disease that affects your ovaries. You've had certain treatments for cancer. These include: Chemotherapy. Hormone therapy. Radiation therapy on the area between your hips (pelvis). You smoke a lot or drink a lot of alcohol. Other family members have gone through menopause early. What are the signs or symptoms? Symptoms are unique to each person. You may have: Hot flashes. Irregular periods. Night sweats. Changes in how you feel about sex. You may have less of a sex drive or feel more discomfort around your sexuality. Vaginal dryness. Headaches. Mood swings. Other symptoms may include: Depression. This is when you feel sad or hopeless. Trouble sleeping. Memory problems or trouble focusing. Irritability. This means getting annoyed easily. Tiredness. Weight gain. Anxiety. This is feeling worried or nervous. You can also have trouble getting pregnant. How is this diagnosed? You may be diagnosed based on: Your medical history. An exam. Your age. Your history of menstrual periods. Your symptoms. Hormone tests. How is this treated? In some cases, no treatment is needed. Talk with your health care provider about if you should get treated. Treatments may include: Menopausal hormone therapy (MHT). Medicines to treat certain  symptoms. Acupuncture. Vitamin or herbal supplements. Before you start treatment, let your provider know if you or anyone in your family has or has had: Heart disease. Breast cancer. Blood clots. Diabetes. Osteoporosis. Follow these instructions at home: Eating and drinking  Eat a balanced diet. It should include: Fresh fruits and vegetables. Whole grains. Soybeans. Eggs. Lean meat. Low-fat dairy. To help prevent hot flashes, stay away from: Alcohol. Drinks with caffeine in them. Spicy foods. Lifestyle Do not smoke, vape, or use nicotine or tobacco. Get at least 30 minutes of physical activity on 5 or more days each week. Get 7-8 hours of sleep each night. Dress in layers that can be taken off if you have a hot flash. Find ways to manage stress. You may want to try: Deep breathing. Meditation. Writing in a journal. General instructions  Take your medicines only as told. Keep track of your periods. Track: When they happen. How heavy they are. How long they last. How much time passes between periods. Keep track of your symptoms. Track: When they start. How often you have them. How long they last. Use vaginal lubricants or moisturizers. These can help with: Vaginal dryness. Comfort during sex. You can still get pregnant if you're having any periods. Make sure you use birth control if you don't want to get pregnant. Contact a health care provider if: You have a very heavy period or pass blood clots. Your period lasts more than 2 days longer than normal. Your period comes back sooner than 21 days. You bleed after having sex. You have pain during sex. You have pain when you pee. You get very bad headaches. You have trouble with your eyesight. Get help right away if: You  have chest pain. You have trouble breathing. You have trouble talking. You have very bad depression. This information is not intended to replace advice given to you by your health care provider.  Make sure you discuss any questions you have with your health care provider. Document Revised: 06/13/2023 Document Reviewed: 06/13/2023 Elsevier Patient Education  2024 [employer].

## 2024-07-06 NOTE — Progress Notes (Signed)
 Subjective:    Patient ID: COREY CAULFIELD, female    DOB: 08/07/1981, 43 y.o.   MRN: [id number]  HPI   Discussed the use of AI scribe software for clinical note transcription with the patient, who gave verbal consent to proceed.  KORINA TRETTER is a 43 year old female who presents with irregular menstrual cycles and perimenopausal symptoms.  She has experienced irregular menstrual cycles for the past six months, with intermittent spotting between periods. Cycle lengths vary from two and a half weeks to six or seven weeks. She reports no cramping and did not mention heavy bleeding. She is not on any hormonal therapy and has had a ParaGard IUD in place for ten years.  She experiences perimenopausal symptoms, including hot flashes and difficulty sleeping. Hot flashes primarily occur in the middle of the night and mid-afternoon, causing profuse sweating. There is no correlation with blood sugar levels. She also reports a lack of sex drive, which she hoped would improve after discontinuing Zoloft , but no change has been noted.  She mentions bladder issues, including urinary incontinence when coughing, sneezing, or when trying to reach the bathroom. She has never had children.  In her past medical history, she recalls developing knots in her lymph nodes while on oral hormonal birth control, which resolved after discontinuation of the medication. She was monitored at [facility] oncology unit for two years during that time.       Review of Systems     Past Medical History:  Diagnosis Date   Constipation    Depression    Diabetes mellitus    Diabetes mellitus without complication (HCC)    Dysphagia    VSD (ventricular septal defect)     Current Outpatient Medications  Medication Sig Dispense Refill   atorvastatin  (LIPITOR) 20 MG tablet Take 1 tablet (20 mg total) by mouth daily. 90 tablet 0   Continuous Blood Gluc Sensor (DEXCOM G6 SENSOR) MISC SMARTSIG:Topical Every 10 Days     Insulin   Disposable Pump (OMNIPOD 5 G6 INTRO, GEN 5,) KIT Inject into the skin.     Insulin  Disposable Pump (OMNIPOD 5 G6 POD, GEN 5,) MISC Inject into the skin.     linaclotide  (LINZESS ) 290 MCG CAPS capsule TAKE 1 CAPSULE (290 MCG TOTAL) BY MOUTH ONCE DAILY 90 capsule 0   PARAGARD INTRAUTERINE COPPER IU 1 Device by Intrauterine route. Every 10 years--inserted 2014     No current facility-administered medications for this visit.    Allergies  Allergen Reactions   Sulfa Antibiotics Hives    massive hives    Family History  Problem Relation Age of Onset   Hypertension Mother    Diabetes Mother    Hyperlipidemia Father    Hypertension Father    Heart disease Maternal Grandfather    Diabetes Maternal Grandfather    Stroke Maternal Grandfather    Hyperlipidemia Paternal Grandmother    Heart disease Paternal Grandmother    Hypertension Paternal Grandmother    Diabetes Paternal Grandmother    Stroke Paternal Grandmother    Hyperlipidemia Paternal Grandfather    Hypertension Paternal Grandfather    Stroke Paternal Grandfather    Cancer Maternal Grandmother        leukemia   Anesthesia problems Neg Hx    Hypotension Neg Hx    Malignant hyperthermia Neg Hx    Pseudochol deficiency Neg Hx     Social History   Socioeconomic History   Marital status: Married    Spouse name: Not  on file   Number of children: Not on file   Years of education: Not on file   Highest education level: Bachelor's degree (e.g., BA, AB, BS)  Occupational History   Not on file  Tobacco Use   Smoking status: Never   Smokeless tobacco: Never  Vaping Use   Vaping status: Never Used  Substance and Sexual Activity   Alcohol use: Yes    Comment: occasional none last 24hrs   Drug use: No   Sexual activity: Yes    Birth control/protection: I.U.D.  Other Topics Concern   Not on file  Social History Narrative   ** Merged History Encounter **       Social Drivers of Health   Financial Resource Strain: Low  Risk  (07/02/2024)   Overall Financial Resource Strain (CARDIA)    Difficulty of Paying Living Expenses: Not hard at all  Food Insecurity: No Food Insecurity (07/02/2024)   Hunger Vital Sign    Worried About Running Out of Food in the Last Year: Never true    Ran Out of Food in the Last Year: Never true  Transportation Needs: No Transportation Needs (07/02/2024)   PRAPARE - Administrator, Civil Service (Medical): No    Lack of Transportation (Non-Medical): No  Physical Activity: Sufficiently Active (07/02/2024)   Exercise Vital Sign    Days of Exercise per Week: 4 days    Minutes of Exercise per Session: 60 min  Stress: Stress Concern Present (07/02/2024)   [employer] of Occupational Health - Occupational Stress Questionnaire    Feeling of Stress: To some extent  Social Connections: Socially Isolated (07/02/2024)   Social Connection and Isolation Panel    Frequency of Communication with Friends and Family: Once a week    Frequency of Social Gatherings with Friends and Family: Never    Attends Religious Services: Never    Database administrator or Organizations: No    Attends Engineer, structural: Not on file    Marital Status: Married  Catering manager Violence: Not on file     Constitutional: Denies fever, malaise, fatigue, headache or abrupt weight changes.  HEENT: Denies eye pain, eye redness, ear pain, ringing in the ears, wax buildup, runny nose, nasal congestion, bloody nose, or sore throat. Respiratory: Denies difficulty breathing, cough, shortness of breath, or sputum production.   Cardiovascular: Denies chest pain, chest tightness, palpitations or swelling in the hands or feet.  Gastrointestinal: Patient reports constipation.  Denies abdominal pain, bloating, diarrhea or blood in the stool.  GU: Pt reports changes in her menstrual cycle, mixed urinary incontinence. Denies urgency, frequency, pain with urination, burning sensation, blood in urine, odor  or discharge. Musculoskeletal: Denies decrease in range of motion, difficulty with gait, muscle pain or joint pain and swelling.  Skin: Denies redness, rashes, lesions or ulcercations.  Neurological: Pt reports hot flashes, insomnia. Denies dizziness, difficulty with memory, difficulty with speech or problems with balance and coordination.  Psych: Patient has a history of anxiety and depression.  Denies SI/HI.  No other specific complaints in a complete review of systems (except as listed in HPI above).  Objective:   Physical Exam BP 122/74 (BP Location: Left Arm, Patient Position: Sitting, Cuff Size: Normal)   Ht 5' 11 (1.803 m)   Wt 224 lb 12.8 oz (102 kg)   LMP 07/06/2024 Comment: spotting since this morning  BMI 31.35 kg/m     Wt Readings from Last 3 Encounters:  01/15/24 230  lb 9.6 oz (104.6 kg)  01/09/24 230 lb 12.8 oz (104.7 kg)  07/15/23 227 lb (103 kg)    General: Appears her stated age, obese, in NAD. Skin: Warm, dry and intact.  Cardiovascular: Normal rate and rhythm. Pulmonary/Chest: Normal effort. No respiratory distress. Musculoskeletal:  No difficulty with gait.  Neurological: Alert and oriented.      BMET    Component Value Date/Time   NA 138 07/15/2023 1124   K 4.5 07/15/2023 1124   CL 101 07/15/2023 1124   CO2 28 07/15/2023 1124   GLUCOSE 225 (H) 07/15/2023 1124   BUN 11 07/15/2023 1124   CREATININE 0.64 07/15/2023 1124   CALCIUM  9.5 07/15/2023 1124   GFRNONAA >60 03/24/2020 1030   GFRAA >60 03/24/2020 1030    Lipid Panel     Component Value Date/Time   CHOL 215 (H) 01/15/2024 1136   TRIG 71 01/15/2024 1136   HDL 95 01/15/2024 1136   CHOLHDL 2.3 01/15/2024 1136   VLDL 32.2 04/06/2020 1542   LDLCALC 104 (H) 01/15/2024 1136    CBC    Component Value Date/Time   WBC 8.3 07/15/2023 1124   RBC 4.18 07/15/2023 1124   HGB 13.3 07/15/2023 1124   HCT 40.9 07/15/2023 1124   PLT 301 07/15/2023 1124   MCV 97.8 07/15/2023 1124   MCH 31.8  07/15/2023 1124   MCHC 32.5 07/15/2023 1124   RDW 11.8 07/15/2023 1124    Hgb A1C Lab Results  Component Value Date   HGBA1C 7.7 11/15/2023          Assessment & Plan:   Assessment and Plan    Irregular menstrual cycles with perimenopausal symptoms Irregular cycles with spotting, hot flashes, night sweats, and decreased libido suggest perimenopause. ParaGard IUD may contribute. Previous lymph node issue resolved after stopping hormonal therapy. - Consider checking FSH and LH levels for menopausal status. - Provided information on bioidentical hormone replacement therapy from [employer]. - Discussed hormonal therapy options, including combined estrogen and progesterone therapy. - Consider switching ParaGard to Mirena IUD for hormonal management if desired. - Encouraged keeping GYN appointment and seeking new GYN recommendations.  Stress and urge urinary incontinence Stress and urge incontinence with leakage during coughing, sneezing, and urgency. No childbirth history, caffeine  may worsen symptoms. Possible cystocele. - Advise reducing caffeine  intake. - Recommend core strengthening exercises, including sit-ups and planks. - Consider pelvic floor physical therapy if symptoms persist. - Discuss non-surgical and surgical treatment options, including Kegel exercises, pelvic floor physical therapy, medications, and bladder sling surgery.        RTC in 1 week for your annual exam Angeline Laura, NP

## 2024-07-20 ENCOUNTER — Encounter: Admitting: Internal Medicine

## 2024-08-03 ENCOUNTER — Encounter: Payer: Self-pay | Admitting: Internal Medicine

## 2024-08-16 ENCOUNTER — Other Ambulatory Visit: Payer: Self-pay | Admitting: Internal Medicine

## 2024-08-17 ENCOUNTER — Encounter: Admitting: Family Medicine

## 2024-08-18 NOTE — Telephone Encounter (Signed)
 Requested Prescriptions  Pending Prescriptions Disp Refills   atorvastatin  (LIPITOR) 20 MG tablet [Pharmacy Med Name: ATORVASTATIN  20MG  TABLETS] 90 tablet 1    Sig: TAKE 1 TABLET(20 MG) BY MOUTH DAILY     Cardiovascular:  Antilipid - Statins Failed - 08/18/2024 10:45 AM      Failed - Lipid Panel in normal range within the last 12 months    Cholesterol  Date Value Ref Range Status  01/15/2024 215 (H) <200 mg/dL Final   LDL Cholesterol (Calc)  Date Value Ref Range Status  01/15/2024 104 (H) mg/dL (calc) Final    Comment:    Reference range: <100 . Desirable range <100 mg/dL for primary prevention;   <70 mg/dL for patients with CHD or diabetic patients  with > or = 2 CHD risk factors. SABRA LDL-C is now calculated using the Martin-Hopkins  calculation, which is a validated novel method providing  better accuracy than the Friedewald equation in the  estimation of LDL-C.  Gladis APPLETHWAITE et al. SANDREA. 7986;689(80): 2061-2068  (http://education.[facility].com/faq/FAQ164)    HDL  Date Value Ref Range Status  01/15/2024 95 > OR = 50 mg/dL Final   Triglycerides  Date Value Ref Range Status  01/15/2024 71 <150 mg/dL Final         Passed - Patient is not pregnant      Passed - Valid encounter within last 12 months    Recent Outpatient Visits           1 month ago Perimenopausal symptom   [facility] [place], [place], NP   7 months ago Type 1 diabetes mellitus with other circulatory complication [facility])   [place] [facility] [place], Angeline ORN, NP   7 months ago Subacute cough   [facility] [place], Angeline ORN, [place]

## 2024-09-16 ENCOUNTER — Encounter: Admitting: Obstetrics and Gynecology

## 2024-10-01 ENCOUNTER — Encounter: Payer: Self-pay | Admitting: Internal Medicine

## 2024-10-01 ENCOUNTER — Ambulatory Visit: Admitting: Internal Medicine

## 2024-10-01 VITALS — BP 128/74 | HR 81 | Temp 98.4°F | Ht 71.0 in | Wt 219.2 lb

## 2024-10-01 DIAGNOSIS — E1059 Type 1 diabetes mellitus with other circulatory complications: Secondary | ICD-10-CM | POA: Diagnosis not present

## 2024-10-01 DIAGNOSIS — J069 Acute upper respiratory infection, unspecified: Secondary | ICD-10-CM

## 2024-10-01 DIAGNOSIS — R6889 Other general symptoms and signs: Secondary | ICD-10-CM

## 2024-10-01 LAB — POC COVID19/FLU A&B COMBO
Covid Antigen, POC: NEGATIVE
Influenza A Antigen, POC: NEGATIVE
Influenza B Antigen, POC: NEGATIVE

## 2024-10-01 LAB — POCT RAPID STREP A (OFFICE): Rapid Strep A Screen: NEGATIVE

## 2024-10-01 MED ORDER — AZITHROMYCIN 250 MG PO TABS: ORAL_TABLET | ORAL | 0 refills | Status: AC

## 2024-10-01 MED ORDER — BENZONATATE 200 MG PO CAPS: 200.0000 mg | ORAL_CAPSULE | Freq: Two times a day (BID) | ORAL | 0 refills | Status: AC | PRN

## 2024-10-01 NOTE — Patient Instructions (Signed)

## 2024-10-01 NOTE — Addendum Note (Signed)
 Addended by: ZELIA GAUZE D on: 10/01/2024 01:31 PM   Modules accepted: Orders

## 2024-10-01 NOTE — Progress Notes (Signed)
 Subjective:    Patient ID: Karen Byrd, female    DOB: [birth year], 42 y.o.   MRN: [id number]  HPI  Discussed the use of AI scribe software for clinical note transcription with the patient, who gave verbal consent to proceed.  Karen Byrd is a 44 year old female with type 1 diabetes who presents with URI symptoms.  She has been experiencing headache, nasal congestion, ear pain, sore throat, cough, nausea, and diarrhea. Fatigue is the most debilitating symptom, accompanied by body aches.  She had a fever of 101.81F yesterday. No shortness of breath, but significant tiredness is noted. She has been using an over-the-counter cold and flu medication, as well as a nasal decongestant, though the specific name is not recalled.  She recently attended a large public event, which may have contributed to her exposure to illness. 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  Initially, her symptoms involved more mucus and drainage about five days ago, which have since worsened.    Review of Systems   Past Medical History:  Diagnosis Date   Constipation    Depression    Diabetes mellitus    Diabetes mellitus without complication (HCC)    Dysphagia    Heart murmur 1981   VSD (ventricular septal defect)     Current Outpatient Medications  Medication Sig Dispense Refill   atorvastatin  (LIPITOR) 20 MG tablet TAKE 1 TABLET(20 MG) BY MOUTH DAILY 90 tablet 1   Continuous Blood Gluc Sensor (DEXCOM G6 SENSOR) MISC SMARTSIG:Topical Every 10 Days     Insulin  Disposable Pump (OMNIPOD 5 G6 INTRO, GEN 5,) KIT Inject into the skin.     Insulin  Disposable Pump (OMNIPOD 5 G6 POD, GEN 5,) MISC Inject into the skin.     linaclotide  (LINZESS ) 290 MCG CAPS capsule TAKE 1 CAPSULE (290 MCG TOTAL) BY MOUTH ONCE DAILY 90 capsule 0   PARAGARD INTRAUTERINE COPPER IU 1 Device by Intrauterine route. Every 10 years--inserted 2014     No current facility-administered  medications for this visit.    Allergies  Allergen Reactions   Sulfa Antibiotics Hives    massive hives    Family History  Problem Relation Age of Onset   Hypertension Mother    Diabetes Mother    Hyperlipidemia Father    Hypertension Father    Heart disease Maternal Grandfather    Diabetes Maternal Grandfather    Stroke Maternal Grandfather    Hyperlipidemia Paternal Grandmother    Heart disease Paternal Grandmother    Hypertension Paternal Grandmother    Diabetes Paternal Grandmother    Stroke Paternal Grandmother    Hyperlipidemia Paternal Grandfather    Hypertension Paternal Grandfather    Stroke Paternal Grandfather    Cancer Maternal Grandmother        leukemia   Anesthesia problems Neg Hx    Hypotension Neg Hx    Malignant hyperthermia Neg Hx    Pseudochol deficiency Neg Hx     Social History   Socioeconomic History   Marital status: Married    Spouse name: Not on file   Number of children: Not on file   Years of education: Not on file   Highest education level: Bachelor's degree (e.g., BA, AB, BS)  Occupational History   Not on file  Tobacco Use   Smoking status: Never   Smokeless tobacco: Never  Vaping Use   Vaping status: Never Used  Substance and Sexual Activity  Alcohol use: Yes    Comment: occasional none last 24hrs   Drug use: No   Sexual activity: Yes    Birth control/protection: I.U.D.  Other Topics Concern   Not on file  Social History Narrative   ** Merged History Encounter **       Social Drivers of Health   Tobacco Use: Low Risk  (08/31/2024)   Received from [facility]   Patient History    Smoking Tobacco Use: Never    Smokeless Tobacco Use: Never    Passive Exposure: Not on file  Financial Resource Strain: Low Risk  (08/18/2024)   Received from [facility]   Overall Financial Resource Strain (CARDIA)    Difficulty of Paying Living Expenses: Not hard at all  Food Insecurity: No Food  Insecurity (08/18/2024)   Received from [facility]   Epic    Within the past 12 months, you worried that your food would run out before you got the money to buy more.: Never true    Within the past 12 months, the food you bought just didn't last and you didn't have money to get more.: Never true  Transportation Needs: No Transportation Needs (08/18/2024)   Received from [facility] - Transportation    In the past 12 months, has lack of transportation kept you from medical appointments or from getting medications?: No    Lack of Transportation (Non-Medical): No  Physical Activity: Sufficiently Active (07/02/2024)   Exercise Vital Sign    Days of Exercise per Week: 4 days    Minutes of Exercise per Session: 60 min  Stress: Stress Concern Present (07/02/2024)   [employer] - Occupational Stress Questionnaire    Feeling of Stress: To some extent  Social Connections: Socially Isolated (07/02/2024)   Social Connection and Isolation Panel    Frequency of Communication with Friends and Family: Once a week    Frequency of Social Gatherings with Friends and Family: Never    Attends Religious Services: Never    Database Administrator or Organizations: No    Attends Engineer, Structural: Not on file    Marital Status: Married  Catering Manager Violence: Not on file  Depression (PHQ2-9): Low Risk (07/06/2024)   Depression (PHQ2-9)    PHQ-2 Score: 0  Alcohol Screen: Low Risk (07/02/2024)   Alcohol Screen    Last Alcohol Screening Score (AUDIT): 3  Housing: Low Risk  (08/18/2024)   Received from [facility]   Epic    In the last 12 months, was there a time when you were not able to pay the mortgage or rent on time?: No    In the past 12 months, how many times have you moved where you were living?: 0    At any time in the past 12 months, were you homeless or living in a shelter (including now)?: No   Utilities: Not At Risk (08/18/2024)   Received from [facility]   Epic    In the past 12 months has the electric, gas, oil, or water company threatened to shut off services in your home?: No  Health Literacy: Not on file     Constitutional: Patient reports headache, fatigue, fever.  Denies malaise,  or abrupt weight changes.  HEENT: Patient reports ear pain, nasal congestion and sore throat.  Denies eye pain, eye redness, ringing in the ears, wax buildup, runny  nose,  bloody nose. Respiratory: Patient reports cough.  Denies difficulty breathing, shortness of breath, or sputum production.   Cardiovascular:  Denies chest pain, chest pressure, palpitations or swelling in the hands or feet.  Gastrointestinal: Patient reports nausea, diarrhea.  Denies abdominal pain, bloating, or blood in the stool.  GU: Denies urgency, frequency, pain with urination, burning sensation, blood in urine, odor or discharge. Musculoskeletal: Patient reports body aches.  Denies decrease in range of motion, difficulty with gait, or joint swelling.  Skin: Denies redness, rashes, lesions or ulcercations.  Neurological: Denies dizziness, difficulty with memory, difficulty with speech or problems with balance and coordination.   No other specific complaints in a complete review of systems (except as listed in HPI above).      Objective:   Physical Exam BP 128/74 (BP Location: Left Arm, Patient Position: Sitting, Cuff Size: Large)   Pulse 81   Temp 98.4 F (36.9 C)   Ht 5' 11 (1.803 m)   Wt 219 lb 3.2 oz (99.4 kg)   SpO2 95%   BMI 30.57 kg/m    Wt Readings from Last 3 Encounters:  07/06/24 224 lb 12.8 oz (102 kg)  01/15/24 230 lb 9.6 oz (104.6 kg)  01/09/24 230 lb 12.8 oz (104.7 kg)    General: Appears her stated age, appears unwell but in NAD. Skin: Warm, dry and intact. No rashes noted. HEENT: Head: Normal shape and size, no sinus tenderness noted.  Eyes: Sclera white, conjunctiva  pink, no icterus, PERRLA.  Nose: Mucosa boggy and moist, turbinates swollen.  Throat: Mucosa pink and moist, + PND, no tonsillar enlargement or exudate noted. Neck: No adenopathy noted. Cardiovascular: Normal rate and rhythm. S1,S2 noted. Murmur noted.  Pulmonary/Chest: Normal effort and positive vesicular breath sounds. No respiratory distress. No wheezes, rales or ronchi noted.  Musculoskeletal:  No difficulty with gait.  Neurological: Alert and oriented.    BMET    Component Value Date/Time   NA 138 07/15/2023 1124   K 4.5 07/15/2023 1124   CL 101 07/15/2023 1124   CO2 28 07/15/2023 1124   GLUCOSE 225 (H) 07/15/2023 1124   BUN 11 07/15/2023 1124   CREATININE 0.64 07/15/2023 1124   CALCIUM  9.5 07/15/2023 1124   GFRNONAA >60 03/24/2020 1030   GFRAA >60 03/24/2020 1030    Lipid Panel     Component Value Date/Time   CHOL 215 (H) 01/15/2024 1136   TRIG 71 01/15/2024 1136   HDL 95 01/15/2024 1136   CHOLHDL 2.3 01/15/2024 1136   VLDL 32.2 04/06/2020 1542   LDLCALC 104 (H) 01/15/2024 1136    CBC    Component Value Date/Time   WBC 8.3 07/15/2023 1124   RBC 4.18 07/15/2023 1124   HGB 13.3 07/15/2023 1124   HCT 40.9 07/15/2023 1124   PLT 301 07/15/2023 1124   MCV 97.8 07/15/2023 1124   MCH 31.8 07/15/2023 1124   MCHC 32.5 07/15/2023 1124   RDW 11.8 07/15/2023 1124    Hgb A1C Lab Results  Component Value Date   HGBA1C 7.7 11/15/2023            Assessment & Plan:  Assessment and Plan    Acute upper respiratory infection Symptoms suggest viral etiology.  Strep, COVID and flu tests negative. No pneumonia or wheezing. Prednisone  not recommended due to diabetes. - Prescribed Tessalon  200 mg tablets, TID for cough. - Recommended Claritin, Allegra, or Zyrtec for nasal congestion. - Suggested Flonase nasal spray for nasal pressure. - Advised Sudafed  for nasal decongestion, max three days. - Prescribed Z-Pak 250 mg x 5 days if symptoms persist 5-7 days. -  Encouraged rest and increased fluid intake.  Type 1 diabetes mellitus Avoid prednisone  due to risk of hyperglycemia.        Schedule an appointment for your annual exam Angeline Laura, NP

## 2024-10-05 ENCOUNTER — Ambulatory Visit: Admitting: Podiatry

## 2024-10-13 ENCOUNTER — Ambulatory Visit: Payer: Self-pay

## 2024-10-13 NOTE — Telephone Encounter (Signed)
 Agree with advice given

## 2024-10-13 NOTE — Telephone Encounter (Signed)
 FYI Only or Action Required?: Action required by provider: request for appointment.  Patient was last seen in primary care on 10/01/2024 by Antonette Angeline ORN, NP.  Called Nurse Triage reporting Fall.  Symptoms began yesterday.  Interventions attempted: Rest, hydration, or home remedies.  Symptoms are: unchanged.Fell down stairs yesterday, injured left ankle. Pain 9/10.  Triage Disposition: See HCP Within 4 Hours (Or PCP Triage)  Patient/caregiver understands and will follow disposition?: Yes      Copied from CRM [id number]. Topic: Clinical - Red Word Triage >> Oct 13, 2024  8:23 AM Karen Byrd wrote: Red Word that prompted transfer to Nurse Triage: Patient Fall. Injured left foot, extremely painful, concerned about sprain or break. Answer Assessment - Initial Assessment Questions 1. MECHANISM: How did the fall happen?     Down stairs 2. DOMESTIC VIOLENCE AND ELDER ABUSE SCREENING: Did you fall because someone pushed you or tried to hurt you? If Yes, ask: Are you safe now?     no 3. ONSET: When did the fall happen? (e.g., minutes, hours, or days ago)     yesterday 4. LOCATION: What part of the body hit the ground? (e.g., back, buttocks, head, hips, knees, hands, head, stomach)     Caught herself 5. INJURY: Did you hurt (injure) yourself when you fell? If Yes, ask: What did you injure? Tell me more about this? (e.g., body area; type of injury; pain severity)     Yes left ankle  6. PAIN: Is there any pain? If Yes, ask: How bad is the pain? (e.g., Scale 0-10; or none, mild,      9 7. SIZE: For cuts, bruises, or swelling, ask: How large is it? (e.g., inches or centimeters)      Swollen , bruised 8. PREGNANCY: Is there any chance you are pregnant? When was your last menstrual period?     no 9. OTHER SYMPTOMS: Do you have any other symptoms? (e.g., dizziness, fever, weakness; new-onset or worsening).      no 10. CAUSE: What do you think caused the fall (or  falling)? (e.g., dizzy spell, tripped)       slipped  Protocols used: Falls and Falling-A-AH  Reason for Disposition  [1] MODERATE weakness (e.g., interferes with work, school, normal activities) AND [2] new-onset or getting worse  Answer Assessment - Initial Assessment Questions 1. MECHANISM: How did the fall happen?     Down stairs 2. DOMESTIC VIOLENCE AND ELDER ABUSE SCREENING: Did you fall because someone pushed you or tried to hurt you? If Yes, ask: Are you safe now?     no 3. ONSET: When did the fall happen? (e.g., minutes, hours, or days ago)     yesterday 4. LOCATION: What part of the body hit the ground? (e.g., back, buttocks, head, hips, knees, hands, head, stomach)     Caught herself 5. INJURY: Did you hurt (injure) yourself when you fell? If Yes, ask: What did you injure? Tell me more about this? (e.g., body area; type of injury; pain severity)     Yes left ankle  6. PAIN: Is there any pain? If Yes, ask: How bad is the pain? (e.g., Scale 0-10; or none, mild,      9 7. SIZE: For cuts, bruises, or swelling, ask: How large is it? (e.g., inches or centimeters)      Swollen , bruised 8. PREGNANCY: Is there any chance you are pregnant? When was your last menstrual period?     no 9. OTHER SYMPTOMS: Do  you have any other symptoms? (e.g., dizziness, fever, weakness; new-onset or worsening).      no 10. CAUSE: What do you think caused the fall (or falling)? (e.g., dizzy spell, tripped)       slipped  Protocols used: Falls and [facility]

## 2024-10-19 ENCOUNTER — Ambulatory Visit: Admitting: Podiatry
